# Patient Record
Sex: Female | Born: 1985 | Race: Black or African American | Hispanic: No | Marital: Single | Smoking: Never smoker
Health system: Southern US, Community
[De-identification: ages and names within clinical notes are randomized; demographics above are authoritative.]

## PROBLEM LIST (undated history)

## (undated) DIAGNOSIS — J45909 Unspecified asthma, uncomplicated: Secondary | ICD-10-CM

## (undated) DIAGNOSIS — Z9889 Other specified postprocedural states: Secondary | ICD-10-CM

## (undated) DIAGNOSIS — N390 Urinary tract infection, site not specified: Secondary | ICD-10-CM

## (undated) HISTORY — PX: ARTHROSCOPY KNEE W/ DRILLING: SUR92

## (undated) HISTORY — DX: Unspecified asthma, uncomplicated: J45.909

---

## 2012-06-27 ENCOUNTER — Ambulatory Visit (INDEPENDENT_AMBULATORY_CARE_PROVIDER_SITE_OTHER): Payer: 59 | Admitting: Obstetrics & Gynecology

## 2012-06-27 ENCOUNTER — Encounter: Payer: Self-pay | Admitting: Obstetrics & Gynecology

## 2012-06-27 VITALS — BP 116/79 | HR 62 | Temp 98.5°F | Resp 16 | Ht 67.0 in | Wt 163.0 lb

## 2012-06-27 DIAGNOSIS — Z124 Encounter for screening for malignant neoplasm of cervix: Secondary | ICD-10-CM

## 2012-06-27 DIAGNOSIS — Z0000 Encounter for general adult medical examination without abnormal findings: Secondary | ICD-10-CM

## 2012-06-27 DIAGNOSIS — Z113 Encounter for screening for infections with a predominantly sexual mode of transmission: Secondary | ICD-10-CM

## 2012-06-27 DIAGNOSIS — Z01419 Encounter for gynecological examination (general) (routine) without abnormal findings: Secondary | ICD-10-CM

## 2012-06-27 DIAGNOSIS — Z1151 Encounter for screening for human papillomavirus (HPV): Secondary | ICD-10-CM

## 2012-06-27 MED ORDER — NORGESTIMATE-ETH ESTRADIOL 0.25-35 MG-MCG PO TABS: 1.0000 | ORAL_TABLET | Freq: Every day | ORAL | Status: DC

## 2012-06-27 NOTE — Progress Notes (Signed)
Subjective:    Mackenzie Kennedy is a 26 y.o. female who presents for an annual exam. The patient has no complaints today. Her periods are 6 days and she showed me a picture on her cell phone of a clot/"mucous" she had with her last period. The patient is sexually active. GYN screening history: last pap: was normal. The patient wears seatbelts: yes. The patient participates in regular exercise: yes. Has the patient ever been transfused or tattooed?: yes. The patient reports that there is not domestic violence in her life.   Menstrual History: OB History    Grav Para Term Preterm Abortions TAB SAB Ect Mult Living   0 0 0 0 0 0 0 0 0 0       Menarche age: 32 Patient's last menstrual period was 06/20/2012.    The following portions of the patient's history were reviewed and updated as appropriate: allergies, current medications, past family history, past medical history, past social history, past surgical history and problem list.  Review of Systems A comprehensive review of systems was negative. She is a Radio producer at [employer]. She is engaged to be married May 2014. 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 She denies dysparunia. She has had her flu shot this year.   Objective:    BP 116/79  Pulse 62  Temp 98.5 F (36.9 C) (Oral)  Resp 16  Ht 5\' 7"  (1.702 m)  Wt 163 lb (73.936 kg)  BMI 25.53 kg/m2  LMP 06/20/2012  General Appearance:    Alert, cooperative, no distress, appears stated age  Head:    Normocephalic, without obvious abnormality, atraumatic  Eyes:    PERRL, conjunctiva/corneas clear, EOM's intact, fundi    benign, both eyes  Ears:    Normal TM's and external ear canals, both ears  Nose:   Nares normal, septum midline, mucosa normal, no drainage    or sinus tenderness  Throat:   Lips, mucosa, and tongue normal; teeth and gums normal  Neck:   Supple, symmetrical, trachea midline, no adenopathy;    thyroid:  no enlargement/tenderness/nodules; no carotid  bruit or JVD  Back:     Symmetric, no curvature, ROM normal, no CVA tenderness  Lungs:     Clear to auscultation bilaterally, respirations unlabored  Chest Wall:    No tenderness or deformity   Heart:    Regular rate and rhythm, S1 and S2 normal, no murmur, rub   or gallop  Breast Exam:    No tenderness, masses, or nipple abnormality  Abdomen:     Soft, non-tender, bowel sounds active all four quadrants,    no masses, no organomegaly  Genitalia:    Normal female without lesion, discharge or tenderness, NSSA, NT, mobile, no adnexal tenderness or masses     Extremities:   Extremities normal, atraumatic, no cyanosis or edema  Pulses:   2+ and symmetric all extremities  Skin:   Skin color, texture, turgor normal, no rashes or lesions  Lymph nodes:   Cervical, supraclavicular, and axillary nodes normal  Neurologic:   CNII-XII intact, normal strength, sensation and reflexes    throughout  .    Assessment:    Healthy female exam.    Plan:     Thin prep Pap smear.  I have told her how to manage her periods by taking active pills continuously. She is disinclined to do this as she likes having the monthly reassuance that she is not pregnant. She will research whether or  not she has had the Gardasil series and will call if she has not.

## 2013-08-06 ENCOUNTER — Other Ambulatory Visit: Payer: Self-pay | Admitting: *Deleted

## 2013-08-06 DIAGNOSIS — IMO0001 Reserved for inherently not codable concepts without codable children: Secondary | ICD-10-CM

## 2013-08-06 MED ORDER — NORGESTIMATE-ETH ESTRADIOL 0.25-35 MG-MCG PO TABS: 1.0000 | ORAL_TABLET | Freq: Every day | ORAL | Status: DC

## 2013-08-06 NOTE — Telephone Encounter (Signed)
Request for 1 Rf on Sprintec sent to [facility] [address] in [place] .  Pt has schedule an appt.

## 2013-08-20 ENCOUNTER — Encounter: Payer: Self-pay | Admitting: *Deleted

## 2013-08-20 ENCOUNTER — Ambulatory Visit (INDEPENDENT_AMBULATORY_CARE_PROVIDER_SITE_OTHER): Payer: 59 | Admitting: Obstetrics & Gynecology

## 2013-08-20 ENCOUNTER — Encounter: Payer: Self-pay | Admitting: Obstetrics & Gynecology

## 2013-08-20 VITALS — BP 115/74 | HR 85 | Resp 16 | Ht 68.0 in | Wt 170.0 lb

## 2013-08-20 DIAGNOSIS — Z01419 Encounter for gynecological examination (general) (routine) without abnormal findings: Secondary | ICD-10-CM

## 2013-08-20 DIAGNOSIS — Z124 Encounter for screening for malignant neoplasm of cervix: Secondary | ICD-10-CM

## 2013-08-20 DIAGNOSIS — Z309 Encounter for contraceptive management, unspecified: Secondary | ICD-10-CM

## 2013-08-20 DIAGNOSIS — Z0000 Encounter for general adult medical examination without abnormal findings: Secondary | ICD-10-CM

## 2013-08-20 DIAGNOSIS — IMO0001 Reserved for inherently not codable concepts without codable children: Secondary | ICD-10-CM

## 2013-08-20 MED ORDER — NORGESTIMATE-ETH ESTRADIOL 0.25-35 MG-MCG PO TABS: 1.0000 | ORAL_TABLET | Freq: Every day | ORAL | Status: DC

## 2013-08-20 NOTE — Patient Instructions (Signed)

## 2013-08-20 NOTE — Progress Notes (Signed)
Subjective:    Mackenzie Kennedy is a 27 y.o. M G73female who presents for an annual exam. The patient has no complaints today. She needs refills of her OCPs. The patient is sexually active. GYN screening history: last pap: was normal. The patient wears seatbelts: yes. The patient participates in regular exercise: yes.(runner)  Has the patient ever been transfused or tattooed?: yes. (tattoo) The patient reports that there is not domestic violence in her life.   Menstrual History: OB History   Grav Para Term Preterm Abortions TAB SAB Ect Mult Living   0 0 0 0 0 0 0 0 0 0       Menarche age: 48 Coitarche: 29   Patient's last menstrual period was 08/13/2013.    The following portions of the patient's history were reviewed and updated as appropriate: allergies, current medications, past family history, past medical history, past social history, past surgical history and problem list.  Review of Systems A comprehensive review of systems was negative. She has not needed an inhaler in 3 years. Married for 7 months, denies dyspareunia. Works at [employer], Production designer, theatre/television/film for a [employer].   Objective:    BP 115/74  Pulse 85  Resp 16  Ht 5\' 8"  (1.727 m)  Wt 170 lb (77.111 kg)  BMI 25.85 kg/m2  LMP 08/13/2013  General Appearance:    Alert, cooperative, no distress, appears stated age  Head:    Normocephalic, without obvious abnormality, atraumatic  Eyes:    PERRL, conjunctiva/corneas clear, EOM's intact, fundi    benign, both eyes  Ears:    Normal TM's and external ear canals, both ears  Nose:   Nares normal, septum midline, mucosa normal, no drainage    or sinus tenderness  Throat:   Lips, mucosa, and tongue normal; teeth and gums normal  Neck:   Supple, symmetrical, trachea midline, no adenopathy;    thyroid:  no enlargement/tenderness/nodules; no carotid   bruit or JVD  Back:     Symmetric, no curvature, ROM normal, no CVA tenderness  Lungs:     Clear to auscultation bilaterally,  respirations unlabored  Chest Wall:    No tenderness or deformity   Heart:    Regular rate and rhythm, S1 and S2 normal, no murmur, rub   or gallop  Breast Exam:    No tenderness, masses, or nipple abnormality  Abdomen:     Soft, non-tender, bowel sounds active all four quadrants,    no masses, no organomegaly  Genitalia:    Normal female without lesion, discharge or tenderness     Extremities:   Extremities normal, atraumatic, no cyanosis or edema  Pulses:   2+ and symmetric all extremities  Skin:   Skin color, texture, turgor normal, no rashes or lesions  Lymph nodes:   Cervical, supraclavicular, and axillary nodes normal  Neurologic:   CNII-XII intact, normal strength, sensation and reflexes    throughout  .    Assessment:    Healthy female exam.    Plan:     Breast self exam technique reviewed and patient encouraged to perform self-exam monthly. Thin prep Pap smear.

## 2014-03-05 ENCOUNTER — Ambulatory Visit (INDEPENDENT_AMBULATORY_CARE_PROVIDER_SITE_OTHER): Payer: 59 | Admitting: Obstetrics & Gynecology

## 2014-03-05 ENCOUNTER — Encounter: Payer: Self-pay | Admitting: Obstetrics & Gynecology

## 2014-03-05 VITALS — BP 124/84 | HR 88 | Wt 183.0 lb

## 2014-03-05 DIAGNOSIS — Z3400 Encounter for supervision of normal first pregnancy, unspecified trimester: Secondary | ICD-10-CM

## 2014-03-05 DIAGNOSIS — Z124 Encounter for screening for malignant neoplasm of cervix: Secondary | ICD-10-CM

## 2014-03-05 DIAGNOSIS — Z3401 Encounter for supervision of normal first pregnancy, first trimester: Secondary | ICD-10-CM

## 2014-03-05 DIAGNOSIS — Z113 Encounter for screening for infections with a predominantly sexual mode of transmission: Secondary | ICD-10-CM

## 2014-03-05 NOTE — Progress Notes (Signed)
Initial prenatal appt today.  Bedside u/s showed IUp with FHT of 160 and CRL 16.102mm and GA of 8w

## 2014-03-05 NOTE — Progress Notes (Signed)
   Subjective:    Mackenzie Kennedy is a G1P0000 6675w1d being seen today for her first obstetrical visit.  Her obstetrical history is significant for none. Patient does intend to breast feed. Pregnancy history fully reviewed.  Patient reports She had spotting old brown blood for about 2 weeks but stopped last week.Ceasar Mons.  Filed Vitals:   03/05/14 1441  BP: 124/84  Pulse: 88  Weight: 183 lb (83.008 kg)    HISTORY: OB History  Gravida Para Term Preterm AB SAB TAB Ectopic Multiple Living  1 0 0 0 0 0 0 0 0 0     # Outcome Date GA Lbr Len/2nd Weight Sex Delivery Anes PTL Lv  1 CUR              Past Medical History  Diagnosis Date  . Asthma   . Pneumonia    Past Surgical History  Procedure Laterality Date  . Arthroscopy knee w/ drilling      lt   Family History  Problem Relation Age of Onset  . Diabetes Mother      Exam    Uterus:     Pelvic Exam:    Perineum: No Hemorrhoids   Vulva: normal   Vagina:  normal mucosa   pH:    Cervix: anteverted   Adnexa: normal adnexa   Bony Pelvis: android  System: Breast:  normal appearance, no masses or tenderness   Skin: normal coloration and turgor, no rashes    Neurologic: oriented   Extremities: normal strength, tone, and muscle mass   HEENT PERRLA   Mouth/Teeth mucous membranes moist, pharynx normal without lesions   Neck supple   Cardiovascular: regular rate and rhythm   Respiratory:  appears well, vitals normal, no respiratory distress, acyanotic, normal RR, ear and throat exam is normal, neck free of mass or lymphadenopathy, chest clear, no wheezing, crepitations, rhonchi, normal symmetric air entry   Abdomen: soft, non-tender; bowel sounds normal; no masses,  no organomegaly   Urinary: urethral meatus normal      Assessment:    Pregnancy: G1P0000 There are no active problems to display for this patient.       Plan:     Initial labs drawn. Prenatal vitamins. Problem list reviewed and updated. Genetic Screening  discussed Quad Screen: requested. She declines First Screen.  Ultrasound discussed; fetal survey: requested.  Follow up in 4 weeks.   Christella App C. 03/05/2014

## 2014-03-06 LAB — OBSTETRIC PANEL
ANTIBODY SCREEN: NEGATIVE
BASOS PCT: 1 % (ref 0–1)
Basophils Absolute: 0.1 10*3/uL (ref 0.0–0.1)
Eosinophils Absolute: 0.2 10*3/uL (ref 0.0–0.7)
Eosinophils Relative: 3 % (ref 0–5)
HEMATOCRIT: 38.3 % (ref 36.0–46.0)
HEMOGLOBIN: 12.9 g/dL (ref 12.0–15.0)
Hepatitis B Surface Ag: NEGATIVE
LYMPHS ABS: 2.3 10*3/uL (ref 0.7–4.0)
Lymphocytes Relative: 37 % (ref 12–46)
MCH: 28.3 pg (ref 26.0–34.0)
MCHC: 33.7 g/dL (ref 30.0–36.0)
MCV: 84 fL (ref 78.0–100.0)
MONO ABS: 0.6 10*3/uL (ref 0.1–1.0)
MONOS PCT: 10 % (ref 3–12)
NEUTROS ABS: 3.1 10*3/uL (ref 1.7–7.7)
Neutrophils Relative %: 49 % (ref 43–77)
Platelets: 270 10*3/uL (ref 150–400)
RBC: 4.56 MIL/uL (ref 3.87–5.11)
RDW: 14.8 % (ref 11.5–15.5)
RH TYPE: POSITIVE
RUBELLA: 0.78 {index} (ref <0.90)
WBC: 6.3 10*3/uL (ref 4.0–10.5)

## 2014-03-06 LAB — HIV ANTIBODY (ROUTINE TESTING W REFLEX): HIV 1&2 Ab, 4th Generation: NONREACTIVE

## 2014-03-06 LAB — SICKLE CELL SCREEN: SICKLE CELL SCREEN: NEGATIVE

## 2014-03-09 LAB — CULTURE, URINE COMPREHENSIVE

## 2014-03-09 LAB — CYTOLOGY - PAP

## 2014-03-10 ENCOUNTER — Telehealth: Payer: Self-pay | Admitting: *Deleted

## 2014-03-10 DIAGNOSIS — B9620 Unspecified Escherichia coli [E. coli] as the cause of diseases classified elsewhere: Secondary | ICD-10-CM | POA: Insufficient documentation

## 2014-03-10 DIAGNOSIS — N390 Urinary tract infection, site not specified: Secondary | ICD-10-CM

## 2014-03-10 MED ORDER — CEPHALEXIN 500 MG PO CAPS: 500.0000 mg | ORAL_CAPSULE | Freq: Three times a day (TID) | ORAL | Status: DC

## 2014-03-10 NOTE — Telephone Encounter (Signed)
Pt notified of UTI and RX sent to pharmacy

## 2014-03-10 NOTE — Telephone Encounter (Signed)
Message copied by Granville LewisLARK, LORA L on Tue Mar 10, 2014  2:55 PM ------      Message from: Allie BossierVE, MYRA C      Created: Tue Mar 10, 2014  2:23 PM       She needs a prescrilption for Keflex 500 mg 1 po QID for a week for a UTI.      Thanks ------

## 2014-04-03 ENCOUNTER — Ambulatory Visit (INDEPENDENT_AMBULATORY_CARE_PROVIDER_SITE_OTHER): Payer: 59 | Admitting: Family

## 2014-04-03 VITALS — BP 142/88 | HR 117 | Wt 178.0 lb

## 2014-04-03 DIAGNOSIS — Z3480 Encounter for supervision of other normal pregnancy, unspecified trimester: Secondary | ICD-10-CM

## 2014-04-03 DIAGNOSIS — Z3490 Encounter for supervision of normal pregnancy, unspecified, unspecified trimester: Secondary | ICD-10-CM | POA: Insufficient documentation

## 2014-04-03 DIAGNOSIS — Z3491 Encounter for supervision of normal pregnancy, unspecified, first trimester: Secondary | ICD-10-CM

## 2014-04-03 NOTE — Progress Notes (Signed)
Reviewed BP with patient.  Discussed if elevated at next visit will obtain baseline PreX labs.  Husband completed 2nd year of medical school, great questions regarding plan of care.

## 2014-05-07 ENCOUNTER — Encounter: Payer: Self-pay | Admitting: Obstetrics & Gynecology

## 2014-05-07 ENCOUNTER — Ambulatory Visit (INDEPENDENT_AMBULATORY_CARE_PROVIDER_SITE_OTHER): Payer: 59 | Admitting: Obstetrics & Gynecology

## 2014-05-07 VITALS — BP 136/75 | HR 94 | Wt 183.0 lb

## 2014-05-07 DIAGNOSIS — Z3480 Encounter for supervision of other normal pregnancy, unspecified trimester: Secondary | ICD-10-CM

## 2014-05-07 DIAGNOSIS — Z3492 Encounter for supervision of normal pregnancy, unspecified, second trimester: Secondary | ICD-10-CM

## 2014-05-07 NOTE — Progress Notes (Signed)
Pt declines quad testing at this time.  She knows she has 3 more weeks for change mind.  Anatomy US ordered.  Not hypertensive today.

## 2014-05-18 ENCOUNTER — Ambulatory Visit (HOSPITAL_COMMUNITY)
Admission: RE | Admit: 2014-05-18 | Discharge: 2014-05-18 | Disposition: A | Discharge status: Home / Self Care | Payer: 59 | Source: Ambulatory Visit | Attending: Obstetrics & Gynecology | Admitting: Obstetrics & Gynecology

## 2014-05-18 DIAGNOSIS — Z3492 Encounter for supervision of normal pregnancy, unspecified, second trimester: Secondary | ICD-10-CM

## 2014-05-18 DIAGNOSIS — Z3689 Encounter for other specified antenatal screening: Secondary | ICD-10-CM | POA: Insufficient documentation

## 2014-05-20 ENCOUNTER — Encounter: Payer: Self-pay | Admitting: Obstetrics & Gynecology

## 2014-06-03 ENCOUNTER — Ambulatory Visit (INDEPENDENT_AMBULATORY_CARE_PROVIDER_SITE_OTHER): Payer: 59 | Admitting: Obstetrics & Gynecology

## 2014-06-03 VITALS — BP 118/74 | HR 79 | Wt 187.0 lb

## 2014-06-03 DIAGNOSIS — Z3492 Encounter for supervision of normal pregnancy, unspecified, second trimester: Secondary | ICD-10-CM

## 2014-06-03 DIAGNOSIS — Z23 Encounter for immunization: Secondary | ICD-10-CM

## 2014-06-03 MED ORDER — INFLUENZA VAC SPLIT QUAD 0.5 ML IM SUSY
0.5000 mL | PREFILLED_SYRINGE | Freq: Once | INTRAMUSCULAR | Status: AC
  Administered 2014-06-03: 0.5 mL via INTRAMUSCULAR

## 2014-06-03 NOTE — Progress Notes (Signed)
Flu shot.  Discussed limitd views of spine and heart and MFM recommendations for F/U anatomy.  Pt will discuss with her husband and will call us today and let us know if she would like another US.

## 2014-06-29 ENCOUNTER — Encounter: Payer: Self-pay | Admitting: Obstetrics & Gynecology

## 2014-07-02 ENCOUNTER — Ambulatory Visit (INDEPENDENT_AMBULATORY_CARE_PROVIDER_SITE_OTHER): Payer: 59 | Admitting: Obstetrics & Gynecology

## 2014-07-02 VITALS — BP 112/70 | HR 89 | Wt 193.0 lb

## 2014-07-02 DIAGNOSIS — Z3492 Encounter for supervision of normal pregnancy, unspecified, second trimester: Secondary | ICD-10-CM

## 2014-07-02 NOTE — Progress Notes (Signed)
Uro-4

## 2014-07-02 NOTE — Progress Notes (Signed)
Pt declines further anatomy scan evaluations.  Tdap and GCT next visit.  Discussed fetal movement.

## 2014-07-30 ENCOUNTER — Encounter: Payer: Self-pay | Admitting: Obstetrics & Gynecology

## 2014-07-30 ENCOUNTER — Ambulatory Visit (INDEPENDENT_AMBULATORY_CARE_PROVIDER_SITE_OTHER): Payer: 59 | Admitting: Obstetrics & Gynecology

## 2014-07-30 VITALS — BP 128/80 | HR 82 | Wt 196.0 lb

## 2014-07-30 DIAGNOSIS — Z36 Encounter for antenatal screening of mother: Secondary | ICD-10-CM

## 2014-07-30 DIAGNOSIS — Z3493 Encounter for supervision of normal pregnancy, unspecified, third trimester: Secondary | ICD-10-CM

## 2014-07-30 DIAGNOSIS — Z23 Encounter for immunization: Secondary | ICD-10-CM

## 2014-07-30 MED ORDER — TETANUS-DIPHTH-ACELL PERTUSSIS 5-2.5-18.5 LF-MCG/0.5 IM SUSP
0.5000 mL | Freq: Once | INTRAMUSCULAR | Status: AC
  Administered 2014-07-30: 0.5 mL via INTRAMUSCULAR

## 2014-07-30 NOTE — Progress Notes (Signed)
Routine visit. Good FM. No problems. Glucola, tdap, labs today. 

## 2014-07-31 LAB — CBC
HCT: 33.9 % — ABNORMAL LOW (ref 36.0–46.0)
Hemoglobin: 11.9 g/dL — ABNORMAL LOW (ref 12.0–15.0)
MCH: 29.2 pg (ref 26.0–34.0)
MCHC: 35.1 g/dL (ref 30.0–36.0)
MCV: 83.3 fL (ref 78.0–100.0)
MPV: 11.9 fL (ref 9.4–12.4)
PLATELETS: 239 10*3/uL (ref 150–400)
RBC: 4.07 MIL/uL (ref 3.87–5.11)
RDW: 14.1 % (ref 11.5–15.5)
WBC: 12.5 10*3/uL — ABNORMAL HIGH (ref 4.0–10.5)

## 2014-07-31 LAB — GLUCOSE TOLERANCE, 1 HOUR (50G) W/O FASTING: Glucose, 1 Hour GTT: 103 mg/dL (ref 70–140)

## 2014-07-31 LAB — HIV ANTIBODY (ROUTINE TESTING W REFLEX): HIV 1&2 Ab, 4th Generation: NONREACTIVE

## 2014-07-31 LAB — RPR

## 2014-08-06 ENCOUNTER — Other Ambulatory Visit (INDEPENDENT_AMBULATORY_CARE_PROVIDER_SITE_OTHER): Payer: Self-pay

## 2014-08-06 NOTE — Progress Notes (Signed)
FMLA forms completed for [employer]. Forms scanned into chart. Pt needs to sign and will need to pick up before faxing.

## 2014-08-13 ENCOUNTER — Ambulatory Visit (INDEPENDENT_AMBULATORY_CARE_PROVIDER_SITE_OTHER): Payer: 59 | Admitting: Obstetrics & Gynecology

## 2014-08-13 VITALS — BP 109/71 | HR 85 | Wt 198.0 lb

## 2014-08-13 DIAGNOSIS — Z3493 Encounter for supervision of normal pregnancy, unspecified, third trimester: Secondary | ICD-10-CM

## 2014-08-13 NOTE — Progress Notes (Signed)
Pt doing well.  Passed GCT.  No problems. Received Tdap.

## 2014-08-26 ENCOUNTER — Ambulatory Visit (INDEPENDENT_AMBULATORY_CARE_PROVIDER_SITE_OTHER): Payer: 59 | Admitting: Obstetrics & Gynecology

## 2014-08-26 VITALS — BP 117/77 | HR 92 | Wt 199.0 lb

## 2014-08-26 DIAGNOSIS — Z3490 Encounter for supervision of normal pregnancy, unspecified, unspecified trimester: Secondary | ICD-10-CM

## 2014-08-26 NOTE — Progress Notes (Signed)
Pt states she has LUQ pain. No c/o acid reflux or indigestion

## 2014-08-26 NOTE — Progress Notes (Signed)
Routine visit. Good FM. No problems. She plans to do some classes in the next few weeks.

## 2014-08-28 NOTE — L&D Delivery Note (Signed)
Operative Delivery Note At 4:18 PM a viable female was delivered via Vaginal, Vacuum Investment banker, operational(Extractor).  Presentation: vertex; Position: Anterior; Station: +2. 1 true pop-off  Verbal consent: obtained from patient.  Risks and benefits discussed in detail.  Vacuum performed 2/2 prolonged deceleration to 60s, contractions spaced out, delivered over 6+ contractions with good movement each contraction, after 2 contractions deceleration resolved although did go down to 60-70s with each contraction, subsequently returning to baseline and with good variability. Risks include, but are not limited to the risks of anesthesia, bleeding, infection, damage to maternal tissues, fetal cephalhematoma.  There is also the risk of inability to effect vaginal delivery of the head, or shoulder dystocia that cannot be resolved by established maneuvers, leading to the need for emergency cesarean section.  NICU called for delivery  APGAR: 5, 8; weight  .   Placenta status: Intact, Spontaneous.   Cord: 3 vessels with the following complications: None.  Cord pH: 7.05  Anesthesia: Epidural  Instruments: Mushroom Vaccuum Episiotomy: Right lateral Lacerations: 2nd degree, essentially only episiotomy Suture Repair: 3.0 vicryl rapide Est. Blood Loss (mL):  300mL  Mom to postpartum.  Baby to Couplet care / Skin to Skin.  [name] 10/18/2014, 4:38 PM

## 2014-09-10 ENCOUNTER — Ambulatory Visit (INDEPENDENT_AMBULATORY_CARE_PROVIDER_SITE_OTHER): Payer: 59 | Admitting: Obstetrics & Gynecology

## 2014-09-10 VITALS — BP 132/83 | HR 81 | Wt 201.0 lb

## 2014-09-10 DIAGNOSIS — Z3403 Encounter for supervision of normal first pregnancy, third trimester: Secondary | ICD-10-CM

## 2014-09-10 NOTE — Progress Notes (Signed)
Info on GBS given to patient. She will have this done next week.

## 2014-09-10 NOTE — Progress Notes (Signed)
Routine visit. Good FM. She took childbirth classes. No problems. Schedule u/s for size less than dates

## 2014-09-17 ENCOUNTER — Ambulatory Visit (INDEPENDENT_AMBULATORY_CARE_PROVIDER_SITE_OTHER): Payer: 59 | Admitting: Obstetrics & Gynecology

## 2014-09-17 ENCOUNTER — Other Ambulatory Visit: Payer: Self-pay | Admitting: Obstetrics & Gynecology

## 2014-09-17 VITALS — BP 119/73 | HR 80 | Wt 202.0 lb

## 2014-09-17 DIAGNOSIS — Z3493 Encounter for supervision of normal pregnancy, unspecified, third trimester: Secondary | ICD-10-CM

## 2014-09-17 DIAGNOSIS — Z36 Encounter for antenatal screening of mother: Secondary | ICD-10-CM

## 2014-09-17 LAB — OB RESULTS CONSOLE GBS: STREP GROUP B AG: POSITIVE

## 2014-09-17 LAB — OB RESULTS CONSOLE GC/CHLAMYDIA
CHLAMYDIA, DNA PROBE: NEGATIVE
Gonorrhea: NEGATIVE

## 2014-09-17 NOTE — Progress Notes (Signed)
Cultures today.  No problems.

## 2014-09-17 NOTE — Progress Notes (Signed)
36 wk cultures today   

## 2014-09-18 LAB — GC/CHLAMYDIA PROBE AMP
CT Probe RNA: NEGATIVE
GC Probe RNA: NEGATIVE

## 2014-09-19 LAB — CULTURE, BETA STREP (GROUP B ONLY)

## 2014-09-25 ENCOUNTER — Ambulatory Visit (INDEPENDENT_AMBULATORY_CARE_PROVIDER_SITE_OTHER): Payer: 59 | Admitting: Advanced Practice Midwife

## 2014-09-25 VITALS — BP 118/77 | HR 86 | Wt 201.0 lb

## 2014-09-25 DIAGNOSIS — Z3493 Encounter for supervision of normal pregnancy, unspecified, third trimester: Secondary | ICD-10-CM

## 2014-09-25 NOTE — Patient Instructions (Signed)
Mackenzie Kennedy Contractions °Contractions of the uterus can occur throughout pregnancy. Contractions are not always a sign that you are in labor.  °WHAT ARE Mackenzie Kennedy CONTRACTIONS?  °Contractions that occur before labor are called Mackenzie Kennedy contractions, or false labor. Toward the end of pregnancy (32-34 weeks), these contractions can develop more often and may become more forceful. This is not true labor because these contractions do not result in opening (dilatation) and thinning of the cervix. They are sometimes difficult to tell apart from true labor because these contractions can be forceful and people have different pain tolerances. You should not feel embarrassed if you go to the hospital with false labor. Sometimes, the only way to tell if you are in true labor is for your health care provider to look for changes in the cervix. °If there are no prenatal problems or other health problems associated with the pregnancy, it is completely safe to be sent home with false labor and await the onset of true labor. °HOW CAN YOU TELL THE DIFFERENCE BETWEEN TRUE AND FALSE LABOR? °False Labor °· The contractions of false labor are usually shorter and not as hard as those of true labor.   °· The contractions are usually irregular.   °· The contractions are often felt in the front of the lower abdomen and in the groin.   °· The contractions may go away when you walk around or change positions while lying down.   °· The contractions get weaker and are shorter lasting as time goes on.   °· The contractions do not usually become progressively stronger, regular, and closer together as with true labor.   °True Labor °1. Contractions in true labor last 30-70 seconds, become very regular, usually become more intense, and increase in frequency.   °2. The contractions do not go away with walking.   °3. The discomfort is usually felt in the top of the uterus and spreads to the lower abdomen and low back.   °4. True labor can  be determined by your health care provider with an exam. This will show that the cervix is dilating and getting thinner.   °WHAT TO REMEMBER °· Keep up with your usual exercises and follow other instructions given by your health care provider.   °· Take medicines as directed by your health care provider.   °· Keep your regular prenatal appointments.   °· Eat and drink lightly if you think you are going into labor.   °· If Mackenzie Kennedy contractions are making you uncomfortable:   °· Change your position from lying down or resting to walking, or from walking to resting.   °· Sit and rest in a tub of warm water.   °· Drink 2-3 glasses of water. Dehydration may cause these contractions.   °· Do slow and deep breathing several times an hour.   °WHEN SHOULD I SEEK IMMEDIATE MEDICAL CARE? °Seek immediate medical care if: °· Your contractions become stronger, more regular, and closer together.   °· You have fluid leaking or gushing from your vagina.   °· You have a fever.   °· You pass blood-tinged mucus.   °· You have vaginal bleeding.   °· You have continuous abdominal pain.   °· You have low back pain that you never had before.   °· You feel your baby's head pushing down and causing pelvic pressure.   °· Your baby is not moving as much as it used to.   °Document Released: 08/14/2005 Document Revised: 08/19/2013 Document Reviewed: 05/26/2013 °Exit[employer]® Patient Information ©2015 [employer]. This information is not intended to replace advice given to you by your health care   provider. Make sure you discuss any questions you have with your health care provider. ° °Fetal Movement Counts °Patient Name: __________________________________________________ Patient Due Date: ____________________ °Performing a fetal movement count is highly recommended in high-risk pregnancies, but it is good for every pregnant woman to do. Your health care provider may ask you to start counting fetal movements at 28 weeks of the pregnancy. Fetal  movements often increase: °· After eating a full meal. °· After physical activity. °· After eating or drinking something sweet or cold. °· At rest. °Pay attention to when you feel the baby is most active. This will help you notice a pattern of your baby's sleep and wake cycles and what factors contribute to an increase in fetal movement. It is important to perform a fetal movement count at the same time each day when your baby is normally most active.  °HOW TO COUNT FETAL MOVEMENTS °5. Find a quiet and comfortable area to sit or lie down on your left side. Lying on your left side provides the best blood and oxygen circulation to your baby. °6. Write down the day and time on a sheet of paper or in a journal. °7. Start counting kicks, flutters, swishes, rolls, or jabs in a 2-hour period. You should feel at least 10 movements within 2 hours. °8. If you do not feel 10 movements in 2 hours, wait 2-3 hours and count again. Look for a change in the pattern or not enough counts in 2 hours. °SEEK MEDICAL CARE IF: °· You feel less than 10 counts in 2 hours, tried twice. °· There is no movement in over an hour. °· The pattern is changing or taking longer each day to reach 10 counts in 2 hours. °· You feel the baby is not moving as he or she usually does. °Date: ____________ Movements: ____________ Start time: ____________ Finish time: ____________  °Date: ____________ Movements: ____________ Start time: ____________ Finish time: ____________ °Date: ____________ Movements: ____________ Start time: ____________ Finish time: ____________ °Date: ____________ Movements: ____________ Start time: ____________ Finish time: ____________ °Date: ____________ Movements: ____________ Start time: ____________ Finish time: ____________ °Date: ____________ Movements: ____________ Start time: ____________ Finish time: ____________ °Date: ____________ Movements: ____________ Start time: ____________ Finish time: ____________ °Date: ____________  Movements: ____________ Start time: ____________ Finish time: ____________  °Date: ____________ Movements: ____________ Start time: ____________ Finish time: ____________ °Date: ____________ Movements: ____________ Start time: ____________ Finish time: ____________ °Date: ____________ Movements: ____________ Start time: ____________ Finish time: ____________ °Date: ____________ Movements: ____________ Start time: ____________ Finish time: ____________ °Date: ____________ Movements: ____________ Start time: ____________ Finish time: ____________ °Date: ____________ Movements: ____________ Start time: ____________ Finish time: ____________ °Date: ____________ Movements: ____________ Start time: ____________ Finish time: ____________  °Date: ____________ Movements: ____________ Start time: ____________ Finish time: ____________ °Date: ____________ Movements: ____________ Start time: ____________ Finish time: ____________ °Date: ____________ Movements: ____________ Start time: ____________ Finish time: ____________ °Date: ____________ Movements: ____________ Start time: ____________ Finish time: ____________ °Date: ____________ Movements: ____________ Start time: ____________ Finish time: ____________ °Date: ____________ Movements: ____________ Start time: ____________ Finish time: ____________ °Date: ____________ Movements: ____________ Start time: ____________ Finish time: ____________  °Date: ____________ Movements: ____________ Start time: ____________ Finish time: ____________ °Date: ____________ Movements: ____________ Start time: ____________ Finish time: ____________ °Date: ____________ Movements: ____________ Start time: ____________ Finish time: ____________ °Date: ____________ Movements: ____________ Start time: ____________ Finish time: ____________ °Date: ____________ Movements: ____________ Start time: ____________ Finish time: ____________ °Date: ____________ Movements: ____________ Start time:  ____________ Finish time: ____________ °Date: ____________ Movements:   ____________ Start time: ____________ Finish time: ____________  °Date: ____________ Movements: ____________ Start time: ____________ Finish time: ____________ °Date: ____________ Movements: ____________ Start time: ____________ Finish time: ____________ °Date: ____________ Movements: ____________ Start time: ____________ Finish time: ____________ °Date: ____________ Movements: ____________ Start time: ____________ Finish time: ____________ °Date: ____________ Movements: ____________ Start time: ____________ Finish time: ____________ °Date: ____________ Movements: ____________ Start time: ____________ Finish time: ____________ °Date: ____________ Movements: ____________ Start time: ____________ Finish time: ____________  °Date: ____________ Movements: ____________ Start time: ____________ Finish time: ____________ °Date: ____________ Movements: ____________ Start time: ____________ Finish time: ____________ °Date: ____________ Movements: ____________ Start time: ____________ Finish time: ____________ °Date: ____________ Movements: ____________ Start time: ____________ Finish time: ____________ °Date: ____________ Movements: ____________ Start time: ____________ Finish time: ____________ °Date: ____________ Movements: ____________ Start time: ____________ Finish time: ____________ °Date: ____________ Movements: ____________ Start time: ____________ Finish time: ____________  °Date: ____________ Movements: ____________ Start time: ____________ Finish time: ____________ °Date: ____________ Movements: ____________ Start time: ____________ Finish time: ____________ °Date: ____________ Movements: ____________ Start time: ____________ Finish time: ____________ °Date: ____________ Movements: ____________ Start time: ____________ Finish time: ____________ °Date: ____________ Movements: ____________ Start time: ____________ Finish time: ____________ °Date:  ____________ Movements: ____________ Start time: ____________ Finish time: ____________ °Date: ____________ Movements: ____________ Start time: ____________ Finish time: ____________  °Date: ____________ Movements: ____________ Start time: ____________ Finish time: ____________ °Date: ____________ Movements: ____________ Start time: ____________ Finish time: ____________ °Date: ____________ Movements: ____________ Start time: ____________ Finish time: ____________ °Date: ____________ Movements: ____________ Start time: ____________ Finish time: ____________ °Date: ____________ Movements: ____________ Start time: ____________ Finish time: ____________ °Date: ____________ Movements: ____________ Start time: ____________ Finish time: ____________ °Document Released: 09/13/2006 Document Revised: 12/29/2013 Document Reviewed: 06/10/2012 °[employer]® Patient Information ©2015 [employer]. This information is not intended to replace advice given to you by your health care provider. Make sure you discuss any questions you have with your health care provider. ° °

## 2014-09-25 NOTE — Progress Notes (Signed)
GBS pos. PCN in labor. Measuring appropriately today. No growth US two visits ago. None needed  For now, but will watch fundal height closely.

## 2014-10-02 ENCOUNTER — Ambulatory Visit (INDEPENDENT_AMBULATORY_CARE_PROVIDER_SITE_OTHER): Payer: 59 | Admitting: Advanced Practice Midwife

## 2014-10-02 VITALS — BP 111/79 | HR 76 | Wt 203.0 lb

## 2014-10-02 DIAGNOSIS — Z3493 Encounter for supervision of normal pregnancy, unspecified, third trimester: Secondary | ICD-10-CM

## 2014-10-02 NOTE — Progress Notes (Signed)
Active baby 

## 2014-10-02 NOTE — Patient Instructions (Signed)
Braxton Peaden Contractions °Contractions of the uterus can occur throughout pregnancy. Contractions are not always a sign that you are in labor.  °WHAT ARE BRAXTON [place] CONTRACTIONS?  °Contractions that occur before labor are called Braxton [place]au contractions, or false labor. Toward the end of pregnancy (32-34 weeks), these contractions can develop more often and may become more forceful. This is not true labor because these contractions do not result in opening (dilatation) and thinning of the cervix. They are sometimes difficult to tell apart from true labor because these contractions can be forceful and people have different pain tolerances. You should not feel embarrassed if you go to the hospital with false labor. Sometimes, the only way to tell if you are in true labor is for your health care provider to look for changes in the cervix. °If there are no prenatal problems or other health problems associated with the pregnancy, it is completely safe to be sent home with false labor and await the onset of true labor. °HOW CAN YOU TELL THE DIFFERENCE BETWEEN TRUE AND FALSE LABOR? °False Labor °· The contractions of false labor are usually shorter and not as hard as those of true labor.   °· The contractions are usually irregular.   °· The contractions are often felt in the front of the lower abdomen and in the groin.   °· The contractions may go away when you walk around or change positions while lying down.   °· The contractions get weaker and are shorter lasting as time goes on.   °· The contractions do not usually become progressively stronger, regular, and closer together as with true labor.   °True Labor °· Contractions in true labor last 30-70 seconds, become very regular, usually become more intense, and increase in frequency.   °· The contractions do not go away with walking.   °· The discomfort is usually felt in the top of the uterus and spreads to the lower abdomen and low back.   °· True labor can be  determined by your health care provider with an exam. This will show that the cervix is dilating and getting thinner.   °WHAT TO REMEMBER °· Keep up with your usual exercises and follow other instructions given by your health care provider.   °· Take medicines as directed by your health care provider.   °· Keep your regular prenatal appointments.   °· Eat and drink lightly if you think you are going into labor.   °· If Braxton Trotter contractions are making you uncomfortable:   °¨ Change your position from lying down or resting to walking, or from walking to resting.   °¨ Sit and rest in a tub of warm water.   °¨ Drink 2-3 glasses of water. Dehydration may cause these contractions.   °¨ Do slow and deep breathing several times an hour.   °WHEN SHOULD I SEEK IMMEDIATE MEDICAL CARE? °Seek immediate medical care if: °· Your contractions become stronger, more regular, and closer together.   °· You have fluid leaking or gushing from your vagina.   °· You have a fever.   °· You pass blood-tinged mucus.   °· You have vaginal bleeding.   °· You have continuous abdominal pain.   °· You have low back pain that you never had before.   °· You feel your baby's head pushing down and causing pelvic pressure.   °· Your baby is not moving as much as it used to.   °Document Released: 08/14/2005 Document Revised: 08/19/2013 Document Reviewed: 05/26/2013 °ExitCare® Patient Information ©2015 [employer]. This information is not intended to replace advice given to you by your health care   provider. Make sure you discuss any questions you have with your health care provider. ° °

## 2014-10-09 ENCOUNTER — Ambulatory Visit (INDEPENDENT_AMBULATORY_CARE_PROVIDER_SITE_OTHER): Payer: 59 | Admitting: Family

## 2014-10-09 VITALS — BP 130/76 | HR 88 | Wt 206.0 lb

## 2014-10-09 DIAGNOSIS — Z3493 Encounter for supervision of normal pregnancy, unspecified, third trimester: Secondary | ICD-10-CM

## 2014-10-09 NOTE — Progress Notes (Signed)
Doing well; no questions or concerns. Reviewed signs of labor.

## 2014-10-16 ENCOUNTER — Encounter: Payer: Self-pay | Admitting: Advanced Practice Midwife

## 2014-10-16 ENCOUNTER — Ambulatory Visit (INDEPENDENT_AMBULATORY_CARE_PROVIDER_SITE_OTHER): Payer: 59 | Admitting: Advanced Practice Midwife

## 2014-10-16 VITALS — BP 119/75 | HR 86 | Wt 209.0 lb

## 2014-10-16 DIAGNOSIS — O480 Post-term pregnancy: Secondary | ICD-10-CM

## 2014-10-16 DIAGNOSIS — Z3403 Encounter for supervision of normal first pregnancy, third trimester: Secondary | ICD-10-CM

## 2014-10-16 DIAGNOSIS — Z3493 Encounter for supervision of normal pregnancy, unspecified, third trimester: Secondary | ICD-10-CM

## 2014-10-16 DIAGNOSIS — Z3A40 40 weeks gestation of pregnancy: Secondary | ICD-10-CM

## 2014-10-16 NOTE — Patient Instructions (Signed)

## 2014-10-16 NOTE — Progress Notes (Signed)
Doing well Birth routines reviewed. Plans epidural. Wants cervical check today. IOL scheduled for next week on Feb 26 7:30pm

## 2014-10-18 ENCOUNTER — Inpatient Hospital Stay (HOSPITAL_COMMUNITY)
Admission: AD | Admit: 2014-10-18 | Discharge: 2014-10-20 | DRG: 775 | Disposition: A | Discharge status: Home / Self Care | Payer: 59 | Source: Ambulatory Visit | Attending: Obstetrics and Gynecology | Admitting: Obstetrics and Gynecology

## 2014-10-18 ENCOUNTER — Inpatient Hospital Stay (HOSPITAL_COMMUNITY): Payer: 59 | Admitting: Anesthesiology

## 2014-10-18 ENCOUNTER — Encounter (HOSPITAL_COMMUNITY): Payer: Self-pay

## 2014-10-18 DIAGNOSIS — Z3A40 40 weeks gestation of pregnancy: Secondary | ICD-10-CM | POA: Diagnosis present

## 2014-10-18 DIAGNOSIS — Z833 Family history of diabetes mellitus: Secondary | ICD-10-CM

## 2014-10-18 DIAGNOSIS — N390 Urinary tract infection, site not specified: Secondary | ICD-10-CM

## 2014-10-18 DIAGNOSIS — Z3493 Encounter for supervision of normal pregnancy, unspecified, third trimester: Secondary | ICD-10-CM

## 2014-10-18 DIAGNOSIS — B9620 Unspecified Escherichia coli [E. coli] as the cause of diseases classified elsewhere: Secondary | ICD-10-CM

## 2014-10-18 DIAGNOSIS — IMO0001 Reserved for inherently not codable concepts without codable children: Secondary | ICD-10-CM

## 2014-10-18 DIAGNOSIS — O471 False labor at or after 37 completed weeks of gestation: Secondary | ICD-10-CM | POA: Diagnosis present

## 2014-10-18 DIAGNOSIS — O99824 Streptococcus B carrier state complicating childbirth: Secondary | ICD-10-CM | POA: Diagnosis present

## 2014-10-18 HISTORY — DX: Other specified postprocedural states: Z98.890

## 2014-10-18 HISTORY — DX: Urinary tract infection, site not specified: N39.0

## 2014-10-18 LAB — CBC
HEMATOCRIT: 36.1 % (ref 36.0–46.0)
HEMOGLOBIN: 12.3 g/dL (ref 12.0–15.0)
MCH: 29 pg (ref 26.0–34.0)
MCHC: 34.1 g/dL (ref 30.0–36.0)
MCV: 85.1 fL (ref 78.0–100.0)
PLATELETS: 206 10*3/uL (ref 150–400)
RBC: 4.24 MIL/uL (ref 3.87–5.11)
RDW: 14.2 % (ref 11.5–15.5)
WBC: 13.1 10*3/uL — AB (ref 4.0–10.5)

## 2014-10-18 LAB — TYPE AND SCREEN
ABO/RH(D): O POS
ANTIBODY SCREEN: NEGATIVE

## 2014-10-18 LAB — ABO/RH: ABO/RH(D): O POS

## 2014-10-18 MED ORDER — EPHEDRINE 5 MG/ML INJ
10.0000 mg | INTRAVENOUS | Status: DC | PRN
  Filled 2014-10-18: qty 2

## 2014-10-18 MED ORDER — OXYCODONE-ACETAMINOPHEN 5-325 MG PO TABS: 2.0000 | ORAL_TABLET | ORAL | Status: DC | PRN

## 2014-10-18 MED ORDER — LACTATED RINGERS IV SOLN: 500.0000 mL | INTRAVENOUS | Status: DC | PRN

## 2014-10-18 MED ORDER — PHENYLEPHRINE 40 MCG/ML (10ML) SYRINGE FOR IV PUSH (FOR BLOOD PRESSURE SUPPORT)
80.0000 ug | PREFILLED_SYRINGE | INTRAVENOUS | Status: DC | PRN
  Filled 2014-10-18: qty 20
  Filled 2014-10-18: qty 2

## 2014-10-18 MED ORDER — LIDOCAINE HCL (PF) 1 % IJ SOLN
INTRAMUSCULAR | Status: DC | PRN
  Administered 2014-10-18 (×2): 8 mL

## 2014-10-18 MED ORDER — OXYCODONE-ACETAMINOPHEN 5-325 MG PO TABS: 1.0000 | ORAL_TABLET | ORAL | Status: DC | PRN

## 2014-10-18 MED ORDER — LACTATED RINGERS IV SOLN
INTRAVENOUS | Status: DC
Start: 2014-10-18 — End: 2014-10-18
  Administered 2014-10-18 (×2): via INTRAVENOUS

## 2014-10-18 MED ORDER — LIDOCAINE HCL (PF) 1 % IJ SOLN
30.0000 mL | INTRAMUSCULAR | Status: DC | PRN
  Filled 2014-10-18: qty 30

## 2014-10-18 MED ORDER — CITRIC ACID-SODIUM CITRATE 334-500 MG/5ML PO SOLN
30.0000 mL | ORAL | Status: DC | PRN
  Filled 2014-10-18: qty 15

## 2014-10-18 MED ORDER — FENTANYL 2.5 MCG/ML BUPIVACAINE 1/10 % EPIDURAL INFUSION (WH - ANES)
INTRAMUSCULAR | Status: DC | PRN
  Administered 2014-10-18: 14 mL/h via EPIDURAL

## 2014-10-18 MED ORDER — OXYTOCIN 40 UNITS IN LACTATED RINGERS INFUSION - SIMPLE MED
1.0000 m[IU]/min | INTRAVENOUS | Status: DC
  Administered 2014-10-18: 2 m[IU]/min via INTRAVENOUS

## 2014-10-18 MED ORDER — ONDANSETRON HCL 4 MG/2ML IJ SOLN: 4.0000 mg | Freq: Four times a day (QID) | INTRAMUSCULAR | Status: DC | PRN

## 2014-10-18 MED ORDER — PHENYLEPHRINE 40 MCG/ML (10ML) SYRINGE FOR IV PUSH (FOR BLOOD PRESSURE SUPPORT)
80.0000 ug | PREFILLED_SYRINGE | INTRAVENOUS | Status: DC | PRN
  Filled 2014-10-18: qty 2

## 2014-10-18 MED ORDER — PENICILLIN G POTASSIUM 5000000 UNITS IJ SOLR
2.5000 10*6.[IU] | INTRAMUSCULAR | Status: DC
Start: 2014-10-18 — End: 2014-10-18
  Administered 2014-10-18 (×2): 2.5 10*6.[IU] via INTRAVENOUS
  Filled 2014-10-18 (×9): qty 2.5

## 2014-10-18 MED ORDER — OXYTOCIN BOLUS FROM INFUSION: 500.0000 mL | INTRAVENOUS | Status: DC

## 2014-10-18 MED ORDER — DIPHENHYDRAMINE HCL 50 MG/ML IJ SOLN: 12.5000 mg | INTRAMUSCULAR | Status: DC | PRN

## 2014-10-18 MED ORDER — OXYTOCIN 40 UNITS IN LACTATED RINGERS INFUSION - SIMPLE MED
62.5000 mL/h | INTRAVENOUS | Status: DC
  Filled 2014-10-18: qty 1000

## 2014-10-18 MED ORDER — LACTATED RINGERS IV SOLN: 500.0000 mL | Freq: Once | INTRAVENOUS | Status: DC

## 2014-10-18 MED ORDER — PENICILLIN G POTASSIUM 5000000 UNITS IJ SOLR
5.0000 10*6.[IU] | Freq: Once | INTRAVENOUS | Status: AC
  Administered 2014-10-18: 5 10*6.[IU] via INTRAVENOUS
  Filled 2014-10-18: qty 5

## 2014-10-18 MED ORDER — TERBUTALINE SULFATE 1 MG/ML IJ SOLN
0.2500 mg | Freq: Once | INTRAMUSCULAR | Status: DC | PRN
  Filled 2014-10-18: qty 1

## 2014-10-18 MED ORDER — ACETAMINOPHEN 325 MG PO TABS: 650.0000 mg | ORAL_TABLET | ORAL | Status: DC | PRN

## 2014-10-18 MED ORDER — FENTANYL 2.5 MCG/ML BUPIVACAINE 1/10 % EPIDURAL INFUSION (WH - ANES)
14.0000 mL/h | INTRAMUSCULAR | Status: DC | PRN
Start: 2014-10-18 — End: 2014-10-18
  Administered 2014-10-18 (×2): 14 mL/h via EPIDURAL
  Filled 2014-10-18 (×2): qty 125

## 2014-10-18 NOTE — Progress Notes (Signed)
Mackenzie MantleShanevea Kennedy is a 29 y.o. G1P0000 at 5256w4d admitted for active labor  Subjective: Pt reports feeling comfortable with epidural  Objective: BP 98/55 mmHg  Pulse 71  Temp(Src) 98.8 F (37.1 C) (Oral)  Resp 16  Ht 5\' 8"  (1.727 m)  Wt 216 lb (97.977 kg)  BMI 32.85 kg/m2  SpO2 100%  LMP 01/07/2014     FHT:  FHR: 140 bpm, variability: moderate,  accelerations:  Present,  decelerations:  Present rare variables UC:   regular, every 4-5 minutes SVE:   Dilation: 4 Effacement (%): 90 Station: -2 Exam by:: Avery DennisonBenji Stanley RN  Labs: Lab Results  Component Value Date   WBC 13.1* 10/18/2014   HGB 12.3 10/18/2014   HCT 36.1 10/18/2014   MCV 85.1 10/18/2014   PLT 206 10/18/2014    Assessment / Plan: Spontaneous labor, progressing normally  Labor: Progressing normally Preeclampsia:  no signs or symptoms of toxicity Fetal Wellbeing:  Category I Pain Control:  Epidural I/D:  GBS pos on PCN Anticipated MOD:  NSVD  Beverely Lowdamo, Elena 10/18/2014, 6:22 AM

## 2014-10-18 NOTE — H&P (Signed)
Mackenzie Kennedy is a 29 y.o. female G1P0000 with IUP at 8827w4d presenting for painful contractions at term. Pt states she has been having regular, every 6-7 minutes contractions, associated with scant vaginal bleeding.  Membranes are intact, with active fetal movement.   [facility] [place] 8 [facility]   Clinic  [address]  Dating  LMP c/w 8 week US  Genetic Screen Declines all genetic testing  Anatomic US nml but limited views of heart.  Declines further US.  GTT  Third trimester: 103  TDaP vaccine 07/30/14  Flu vaccine 06/03/14  GBS positive  Contraception Considering Mirena and Nexplanon  Baby Food Breast   Circumcision girl  Pediatrician   Support Person Justin   Pap neg; GC/CT negx2  Prenatal History/Complications:  Past Medical History: Past Medical History  Diagnosis Date  . Asthma   . Urinary tract infection     Past Surgical History: Past Surgical History  Procedure Laterality Date  . Arthroscopy knee w/ drilling      lt    Obstetrical History: OB History    Gravida Para Term Preterm AB TAB SAB Ectopic Multiple Living   1 0 0 0 0 0 0 0 0 0       Social History: History   Social History  . Marital Status: Single    Spouse Name: N/A  . Number of Children: N/A  . Years of Education: N/A   Occupational History  . engineer    Social History Main Topics  . Smoking status: Never Smoker   . Smokeless tobacco: Never Used  . Alcohol Use: Yes     Comment: rarely  . Drug Use: No  . Sexual Activity:    Partners: Male   Other Topics Concern  . None   Social History Narrative    Family History: Family History  Problem Relation Age of Onset  . Diabetes Mother     Allergies: No Known Allergies  Prescriptions prior to admission  Medication Sig Dispense Refill Last Dose  . Prenatal Multivit-Min-Fe-FA (PRENATAL VITAMINS PO) Take by mouth.   10/17/2014 at Unknown time     Prenatal Transfer Tool  Maternal Diabetes: No Genetic Screening:  Declined Maternal Ultrasounds/Referrals: Normal Fetal Ultrasounds or other Referrals:  None Maternal Substance Abuse:  No Significant Maternal Medications:  None Significant Maternal Lab Results: Lab values include: Group B Strep positive   Review of Systems   Constitutional: Negative for fever and chills Eyes: Negative for visual disturbances Respiratory: Negative for shortness of breath, dyspnea Cardiovascular: Negative for chest pain or palpitations  Gastrointestinal: Negative for vomiting, diarrhea and constipation.  POSITIVE for abdominal pain (contractions) Genitourinary: Negative for dysuria and urgency Musculoskeletal: Negative for back pain, joint pain, myalgias  Neurological: Negative for dizziness and headaches    Blood pressure 129/87, pulse 76, temperature 98.7 F (37.1 C), temperature source Oral, resp. rate 16, height 5\' 8"  (1.727 m), weight 216 lb (97.977 kg), last menstrual period 01/07/2014, SpO2 98 %. General appearance: alert, cooperative and no distress Lungs: clear to auscultation bilaterally Heart: regular rate and rhythm Abdomen: soft, non-tender; bowel sounds normal Pelvic: normal, 4/90/-2 Extremities: no sign of DVT Presentation: cephalic Fetal monitoring  Baseline: 140 bpm, Variability: Good {> 6 bpm), Accelerations: Reactive and Decelerations: Absent Uterine activity  Frequency: Every 2-4 minutes Dilation: 4 Effacement (%): 90 Station: -2 Exam by:: Remigio EisenmengerBenji Stanley RN   Prenatal labs: ABO, Rh: O/POS/-- (07/09 1525) Antibody: NEG (07/09 1525) Rubella:  non-immune RPR: NON REAC (12/03 1657)  HBsAg:  NEGATIVE (07/09 1525)  HIV: NONREACTIVE (12/03 1657)  GBS:   Positive 1 hr Glucola 103 Genetic screening  declined Anatomy US normal but limited views of heart and declined repeat   No results found for this or any previous visit (from the past 24 hour(s)).  Assessment: Mackenzie Kennedy is a 28 y.o. G1P0000 with an IUP at [redacted]w[redacted]d presenting for  painful contractions at term  Plan: #Labor: spontaneous, progressing normally  #Pain:  Planning epidural #FWB category 1 #ID: GBS: positive, start penicillin  #MOF:  breast #MOC: considering mirena vs nexplanon #Circ: n/a   Beverely Low 10/18/2014, 2:56 AM   I have seen this patient and agree with the above resident's note.  LEFTWICH-KIRBY, Macon Lesesne Certified Nurse-Midwife

## 2014-10-18 NOTE — Anesthesia Procedure Notes (Signed)
Epidural Patient location during procedure: OB Start time: 10/18/2014 4:21 AM End time: 10/18/2014 4:25 AM  Staffing Anesthesiologist: Leilani AbleHATCHETT, Felisia Balcom Performed by: anesthesiologist   Preanesthetic Checklist Completed: patient identified, surgical consent, pre-op evaluation, timeout performed, IV checked, risks and benefits discussed and monitors and equipment checked  Epidural Patient position: sitting Prep: site prepped and draped and DuraPrep Patient monitoring: continuous pulse ox and blood pressure Approach: midline Location: L3-L4 Injection technique: LOR air  Needle:  Needle type: Tuohy  Needle gauge: 17 G Needle length: 9 cm and 9 Needle insertion depth: 7 cm Catheter type: closed end flexible Catheter size: 19 Gauge Catheter at skin depth: 11 cm Test dose: negative and Other  Assessment Sensory level: T9 Events: blood not aspirated, injection not painful, no injection resistance, negative IV test and no paresthesia  Additional Notes Reason for block:procedure for pain

## 2014-10-18 NOTE — Anesthesia Preprocedure Evaluation (Signed)

## 2014-10-18 NOTE — MAU Note (Signed)
Contractions every 6-7 min over the last hour , hour/half, they have become more painful.  No bleeding, no leaking.  Baby moving well.

## 2014-10-18 NOTE — Consult Note (Signed)
Neonatology Note:   Attendance at Delivery:   I was asked by Dr. Eure to attend this vacuum-assisted vaginal delivery at term due to persistent FH decelerations. The mother is a G1P0 O pos, GBS pos (adequately treated during labor) with an uncomplicated pregnancy. ROM 1 hours prior to delivery, fluid clear. Infant floppy and blue, breathing but not crying at birth. After bulb suctioning a large amount of thick mucous from the throat and nares, she started to cry. Color improved quickly. Tone recovered more slowly, but was improved at 5 minutes,without focal deficits. Ap 5/8. Lungs clear to ausc in DR, no resp distress. To CN to care of Pediatrician.  Mackenzie Harpole C. Tadhg Eskew, MD 

## 2014-10-18 NOTE — Progress Notes (Signed)
Mackenzie Kennedy is a 29 y.o. G1P0000 at 2770w4d admitted for active labor  Subjective: Pt reports feeling comfortable with epidural  Objective: BP 108/53 mmHg  Pulse 84  Temp(Src) 98.8 F (37.1 C) (Oral)  Resp 16  Ht 5\' 8"  (1.727 m)  Wt 216 lb (97.977 kg)  BMI 32.85 kg/m2  SpO2 100%  LMP 01/07/2014     FHT:  FHR: 140 bpm, variability: moderate,  accelerations:  Present,  decelerations:  Present rare variables UC:   regular, every 4-5 minutes SVE:   Dilation: 6 Effacement (%): 80 Station: -2 Exam by:: Loreta AveAcosta asynclitic   Labs: Lab Results  Component Value Date   WBC 13.1* 10/18/2014   HGB 12.3 10/18/2014   HCT 36.1 10/18/2014   MCV 85.1 10/18/2014   PLT 206 10/18/2014    Assessment / Plan: Spontaneous labor  Labor: essentially no change since last exam, start pitocin 0620 5/80/-2 0930 6/80/-2 asynclitic => start pitocin Preeclampsia:  no signs or symptoms of toxicity Fetal Wellbeing:  Category I Pain Control:  Epidural I/D:  GBS pos on PCN Anticipated MOD:  NSVD  Mackenzie Kennedy Mackenzie Kennedy 10/18/2014, 9:33 AM

## 2014-10-18 NOTE — MAU Note (Signed)
[facility] for pt to come to [facility].

## 2014-10-19 ENCOUNTER — Encounter (HOSPITAL_COMMUNITY): Payer: Self-pay | Admitting: *Deleted

## 2014-10-19 LAB — RPR: RPR Ser Ql: NONREACTIVE

## 2014-10-19 MED ORDER — OXYTOCIN 40 UNITS IN LACTATED RINGERS INFUSION - SIMPLE MED: 62.5000 mL/h | INTRAVENOUS | Status: DC | PRN

## 2014-10-19 MED ORDER — BISACODYL 10 MG RE SUPP: 10.0000 mg | Freq: Every day | RECTAL | Status: DC | PRN

## 2014-10-19 MED ORDER — DIBUCAINE 1 % RE OINT: 1.0000 "application " | TOPICAL_OINTMENT | RECTAL | Status: DC | PRN

## 2014-10-19 MED ORDER — IBUPROFEN 600 MG PO TABS
600.0000 mg | ORAL_TABLET | Freq: Four times a day (QID) | ORAL | Status: DC
  Administered 2014-10-19 – 2014-10-20 (×7): 600 mg via ORAL
  Filled 2014-10-19 (×7): qty 1

## 2014-10-19 MED ORDER — SENNOSIDES-DOCUSATE SODIUM 8.6-50 MG PO TABS
2.0000 | ORAL_TABLET | ORAL | Status: DC
  Administered 2014-10-19 (×2): 2 via ORAL
  Filled 2014-10-19: qty 2

## 2014-10-19 MED ORDER — ONDANSETRON HCL 4 MG/2ML IJ SOLN: 4.0000 mg | INTRAMUSCULAR | Status: DC | PRN

## 2014-10-19 MED ORDER — ZOLPIDEM TARTRATE 5 MG PO TABS: 5.0000 mg | ORAL_TABLET | Freq: Every evening | ORAL | Status: DC | PRN

## 2014-10-19 MED ORDER — ONDANSETRON HCL 4 MG PO TABS: 4.0000 mg | ORAL_TABLET | ORAL | Status: DC | PRN

## 2014-10-19 MED ORDER — SODIUM CHLORIDE 0.9 % IJ SOLN: 3.0000 mL | Freq: Two times a day (BID) | INTRAMUSCULAR | Status: DC

## 2014-10-19 MED ORDER — SODIUM CHLORIDE 0.9 % IV SOLN: 250.0000 mL | INTRAVENOUS | Status: DC | PRN

## 2014-10-19 MED ORDER — LANOLIN HYDROUS EX OINT: TOPICAL_OINTMENT | CUTANEOUS | Status: DC | PRN

## 2014-10-19 MED ORDER — BENZOCAINE-MENTHOL 20-0.5 % EX AERO
1.0000 "application " | INHALATION_SPRAY | CUTANEOUS | Status: DC | PRN
  Administered 2014-10-19: 1 via TOPICAL
  Filled 2014-10-19: qty 56

## 2014-10-19 MED ORDER — WITCH HAZEL-GLYCERIN EX PADS: 1.0000 "application " | MEDICATED_PAD | CUTANEOUS | Status: DC | PRN

## 2014-10-19 MED ORDER — PRENATAL MULTIVITAMIN CH
1.0000 | ORAL_TABLET | Freq: Every day | ORAL | Status: DC
  Administered 2014-10-19 – 2014-10-20 (×2): 1 via ORAL
  Filled 2014-10-19 (×2): qty 1

## 2014-10-19 MED ORDER — DIPHENHYDRAMINE HCL 25 MG PO CAPS: 25.0000 mg | ORAL_CAPSULE | Freq: Four times a day (QID) | ORAL | Status: DC | PRN

## 2014-10-19 MED ORDER — FLEET ENEMA 7-19 GM/118ML RE ENEM: 1.0000 | ENEMA | Freq: Every day | RECTAL | Status: DC | PRN

## 2014-10-19 MED ORDER — OXYCODONE-ACETAMINOPHEN 5-325 MG PO TABS: 1.0000 | ORAL_TABLET | ORAL | Status: DC | PRN

## 2014-10-19 MED ORDER — SIMETHICONE 80 MG PO CHEW: 80.0000 mg | CHEWABLE_TABLET | ORAL | Status: DC | PRN

## 2014-10-19 MED ORDER — SODIUM CHLORIDE 0.9 % IJ SOLN: 3.0000 mL | INTRAMUSCULAR | Status: DC | PRN

## 2014-10-19 NOTE — Anesthesia Postprocedure Evaluation (Signed)
  Anesthesia Post-op Note  Anesthesia Post Note  Patient: Mackenzie Kennedy  Procedure(s) Performed: * No procedures listed *  Anesthesia type: Epidural  Patient location: Mother/Baby  Post pain: Pain level controlled  Post assessment: Post-op Vital signs reviewed  Last Vitals:  Filed Vitals:   10/19/14 0546  BP: 111/54  Pulse: 66  Temp: 36.9 C  Resp: 18    Post vital signs: Reviewed  Level of consciousness:alert  Complications: No apparent anesthesia complications

## 2014-10-19 NOTE — Lactation Note (Addendum)
This note was copied from the chart of Mackenzie Kennedy [place]. Lactation Consultation Note  Mom reports that BF has been going well.  Baby had eaten one hour prior to consult and was not actively cuing.  She latched briefly but did not suckle deeply consistently.  Mom was concerned about not having "enough" for the baby so we discussed stomach capacity, supply and demand.  Hand expression was taught.  I asked mom to call for a latch check at next feeding.  Follow-up planned. Patient Name: Mackenzie Kennedy Pretty ZOXWR'UToday's Date: 10/19/2014 Reason for consult: Initial assessment   Maternal Data Has patient been taught Hand Expression?: Yes Does the patient have breastfeeding experience prior to this delivery?: No  Feeding Feeding Type: Breast Fed Length of feed: 14 min  LATCH Score/Interventions Latch: Repeated attempts needed to sustain latch, nipple held in mouth throughout feeding, stimulation needed to elicit sucking reflex.  Audible Swallowing: A few with stimulation  Type of Nipple: Flat (Short shank)  Comfort (Breast/Nipple): Soft / non-tender     Hold (Positioning): Assistance needed to correctly position infant at breast and maintain latch.  LATCH Score: 6  Lactation Tools Discussed/Used Pump Review: Setup, frequency, and cleaning   Consult Status Consult Status: Follow-up Date: 10/20/14 Follow-up type: In-patient    Soyla DryerJoseph, Laelynn Blizzard 10/19/2014, 1:58 PM

## 2014-10-19 NOTE — Progress Notes (Signed)
Post Partum Day 1 Subjective: no complaints, up ad lib, voiding, tolerating PO and + flatus Perineum is sore Breastfeeding well  Objective: Blood pressure 111/54, pulse 66, temperature 98.4 F (36.9 C), temperature source Oral, resp. rate 18, height 5\' 8"  (1.727 m), weight 216 lb (97.977 kg), last menstrual period 01/07/2014, SpO2 100 %, unknown if currently breastfeeding.  Physical Exam:  General: alert, cooperative and no distress Lochia: appropriate Uterine Fundus: firm Incision: healing well DVT Evaluation: No evidence of DVT seen on physical exam.   Recent Labs  10/18/14 0255  HGB 12.3  HCT 36.1    Assessment/Plan: Plan for discharge tomorrow, Breastfeeding and Lactation consult   LOS: 1 day   [facility] Center[facility]ILLIAMS,Donia Yokum 10/19/2014, 8:02 AM

## 2014-10-20 ENCOUNTER — Other Ambulatory Visit: Payer: 59

## 2014-10-20 MED ORDER — MEASLES, MUMPS & RUBELLA VAC ~~LOC~~ INJ
0.5000 mL | INJECTION | Freq: Once | SUBCUTANEOUS | Status: AC
Start: 2014-10-20 — End: 2014-10-20
  Administered 2014-10-20: 0.5 mL via SUBCUTANEOUS
  Filled 2014-10-20 (×2): qty 0.5

## 2014-10-20 NOTE — Discharge Instructions (Signed)

## 2014-10-20 NOTE — Discharge Summary (Signed)
Obstetric Discharge Summary Reason for Admission: onset of labor Prenatal Procedures: ultrasound Intrapartum Procedures: spontaneous vaginal delivery Postpartum Procedures: none Complications-Operative and Postpartum: 2nd degree, essentially only episiotomy   Delivery Note At 4:18 PM a viable female was delivered via Vaginal, Vacuum (Extractor) (Presentation: Left Occiput Anterior).  APGAR: 5, 8; weight 6 lb 3.8 oz (2830 g).   Placenta status: Intact, Spontaneous.  Cord: 3 vessels with the following complications: None.  Cord pH: 7.05   Hospital Course:  Active Problems:   Active labor at term   Mackenzie Kennedy is a 29 y.o. G1P1001 s/p SVD.  She has postpartum course that was uncomplicated including no problems with ambulating, PO intake, urination, pain, or bleeding. The pt feels ready to go home and  will be discharged with outpatient follow-up.   Today: No acute events overnight.  Pt denies problems with ambulating, voiding or po intake.  She denies nausea or vomiting.  Pain is well controlled.  She has had flatus. She has had bowel movement.  Lochia Minimal.  Plan for birth control is  mirena vs nexplanon.  Method of Feeding: breast  H/H: Lab Results  Component Value Date/Time   HGB 12.3 10/18/2014 02:55 AM   HCT 36.1 10/18/2014 02:55 AM    Discharge Diagnoses: Term Pregnancy-delivered  Discharge Information: Date: 10/20/2014 Activity: pelvic rest Diet: routine  Medications: None Breast feeding:  Yes Condition: stable Instructions: refer to handout Discharge to: home      Medication List    TAKE these medications        PRENATAL VITAMINS PO  Take 1 tablet by mouth daily.           Follow-up Information    Follow up with DOVE,MYRA C., MD. Schedule an appointment as soon as possible for a visit in 6 weeks.   Specialty:  Obstetrics and Gynecology   Why:  postpartum follow up   Contact information:   [address] [place]  [id number] [phone]       Myra RudeSchmitz, Jeremy E ,MD PGY-2  10/20/2014,7:28 AM  OB fellow attestation I have seen and examined this patient and agree with above documentation in the resident's note.   Mackenzie Kennedy is a 29 y.o. G1P1001 s/p VAVD.   Pain is well controlled.  Plan for birth control is Mirena IUD vs Nexplanon.  Method of Feeding: breast  PE:  BP 102/63 mmHg  Pulse 80  Temp(Src) 98.3 F (36.8 C) (Oral)  Resp 18  Ht 5\' 8"  (1.727 m)  Wt 216 lb (97.977 kg)  BMI 32.85 kg/m2  SpO2 100%  LMP 01/07/2014  Breastfeeding? Unknown Fundus firm   Recent Labs  10/18/14 0255  HGB 12.3  HCT 36.1     Plan: discharge today - postpartum care discussed - f/u clinic in 6 weeks for postpartum visit   William DaltonMcEachern, Snigdha Howser, MD 7:37 AM

## 2014-10-23 ENCOUNTER — Inpatient Hospital Stay (HOSPITAL_COMMUNITY): Admission: RE | Admit: 2014-10-23 | Payer: 59 | Source: Ambulatory Visit

## 2014-11-27 ENCOUNTER — Encounter: Payer: Self-pay | Admitting: Family

## 2014-11-27 ENCOUNTER — Ambulatory Visit (INDEPENDENT_AMBULATORY_CARE_PROVIDER_SITE_OTHER): Payer: 59 | Admitting: Family

## 2014-11-27 NOTE — Progress Notes (Signed)
Patient ID: Mackenzie Kennedy, female   DOB: 01/17/1986, 29 y.o.   MRN: [id number] Post Partum Exam  Mackenzie Kennedy is a 29 y.o. female  who presents for a postpartum visit. She is 6 weeks postpartum following a vacuum assisted SVD. I have fully reviewed the prenatal and intrapartum course. The delivery was at 40w 4d gestational weeks. Outcome:female child. Anesthesia: epidural Postpartum course has been unremarkable. Baby's course has been unremarkable. Baby is feeding by breast. Bleeding no bleeding. No bleeding x 2 weeks.  Bowel function is normal. Bladder function is normal. Patient is not sexually active. Contraception method is undecided.   Last pap smear July 2015, negative.  Postpartum depression screening: negative  The following portions of the patient's history were reviewed and updated as appropriate: allergies, current medications, past family history, past medical history, past social history, past surgical history and problem list.  Review of Systems Pertinent items are noted in HPI.   Objective:   Filed Vitals:   11/27/14 0944  BP: 122/78  Pulse: 82  Resp: 16    General:  alert, cooperative and appears stated age   Breasts:  inspection negative, no nipple discharge or bleeding, no masses or nodularity palpable  Lungs: clear to auscultation bilaterally  Heart:  regular rate and rhythm, S1, S2 normal, no murmur, click, rub or gallop  Abdomen: soft, non-tender; bowel sounds normal; no masses,  no organomegaly   Vulva:  normal  Vagina: normal vagina, no discharge, exudate, lesion, or erythema; healed well  Cervix:  no cervical motion tenderness  Corpus: normal size, contour, position, consistency, mobility, non-tender  Adnexa:  normal adnexa  Rectal Exam: Not performed.   Assessment:    Normal postpartum exam. Pap smear not done at today's visit.   Plan:    1. Contraception: plans to discuss with husband.  Deciding between Mirena and OCPs.  Reviewed the risks and benefits  of both.    2. Follow up in: one week once a decision has been made regarding family planning.    Eino FarberWalidah Kennith GainN Karim, CNM

## 2014-11-30 ENCOUNTER — Ambulatory Visit: Payer: 59 | Admitting: Obstetrics & Gynecology

## 2014-12-04 ENCOUNTER — Ambulatory Visit (INDEPENDENT_AMBULATORY_CARE_PROVIDER_SITE_OTHER): Payer: 59 | Admitting: Advanced Practice Midwife

## 2014-12-04 ENCOUNTER — Encounter: Payer: Self-pay | Admitting: Advanced Practice Midwife

## 2014-12-04 VITALS — BP 127/73 | HR 81 | Resp 16

## 2014-12-04 DIAGNOSIS — Z01812 Encounter for preprocedural laboratory examination: Secondary | ICD-10-CM | POA: Diagnosis not present

## 2014-12-04 DIAGNOSIS — Z30430 Encounter for insertion of intrauterine contraceptive device: Secondary | ICD-10-CM | POA: Diagnosis not present

## 2014-12-04 LAB — POCT URINE PREGNANCY: Preg Test, Ur: NEGATIVE

## 2014-12-04 MED ORDER — LEVONORGESTREL 20 MCG/24HR IU IUD
1.0000 | INTRAUTERINE_SYSTEM | Freq: Once | INTRAUTERINE | Status: AC
  Administered 2014-12-04: 1 via INTRAUTERINE

## 2014-12-04 NOTE — Patient Instructions (Signed)

## 2014-12-07 NOTE — Progress Notes (Signed)
Subjective:     Patient ID: Mackenzie Kennedy, Mackenzie Mantlefemale   DOB: 10/08/1985, 29 y.o.   MRN: [id number]  HPI: Here for Mirena IUD insertion. Has not had IC since before delivery.    Review of Systems; Neg for abd pain, vaginal bleeding, fever, chills.      Objective:   Physical Exam  BP 127/73 mmHg  Pulse 81  Resp 16  Breastfeeding? Yes PELVIC: NEFG. Laceration well-healed. Scant thin white vaginal discharge. Cervic closed, clean.   PROCEDURE NOTE: Patient identified, informed consent performed, signed copy in chart, time out was performed.  Urine pregnancy test negative.  Speculum placed in the vagina.  Cervix visualized.  Cleaned with Betadine x 2.  Grasped anteriourly with a single tooth tenaculum.  Uterus sounded to 7 cm.  Mirena IUD placed per manufacturer's recommendations.  Strings trimmed to 3 cm. Pt tolerated well.   Patient given post procedure instructions and Mirena care card with expiration date.  Patient is asked to check IUD strings periodically and follow up in 4-6 weeks for IUD check.    Assessment:     Mirena insertion     Plan:     Follow up in 4-6 weeks for IUD check. Mirena after-care handout given.    [place] Delainee Tramel, CNM 12/07/2014 9:01 AM

## 2015-01-04 ENCOUNTER — Ambulatory Visit (INDEPENDENT_AMBULATORY_CARE_PROVIDER_SITE_OTHER): Payer: 59 | Admitting: Advanced Practice Midwife

## 2015-01-04 ENCOUNTER — Encounter: Payer: Self-pay | Admitting: Advanced Practice Midwife

## 2015-01-04 VITALS — BP 132/87 | HR 68 | Resp 16 | Ht 68.0 in | Wt 188.0 lb

## 2015-01-04 DIAGNOSIS — O924 Hypogalactia: Secondary | ICD-10-CM

## 2015-01-04 DIAGNOSIS — Z30431 Encounter for routine checking of intrauterine contraceptive device: Secondary | ICD-10-CM

## 2015-01-04 DIAGNOSIS — Z975 Presence of (intrauterine) contraceptive device: Secondary | ICD-10-CM

## 2015-01-04 NOTE — Patient Instructions (Signed)
Dr. Marshell LevanJack Kennedy   Fenugreek (2 caps three times per day)   Breastfeeding Challenges and Solutions Even though breastfeeding is natural, it can be challenging, especially in the first few weeks after childbirth. It is normal for problems to arise when starting to breastfeed your new baby, even if you have breastfed before. This document provides some solutions to the most common breastfeeding challenges.  CHALLENGES AND SOLUTIONS Challenge--Cracked or Sore Nipples Cracked or sore nipples are commonly experienced by breastfeeding mothers. Cracked or sore nipples often are caused by inadequate latching (when your baby's mouth attaches to your breast to breastfeed). Soreness can also happen if your baby is not positioned properly at your breast. Although nipple cracking and soreness are common during the first week after birth, nipple pain is never normal. If you experience nipple cracking or soreness that lasts longer than 1 week or nipple pain, call your health care provider or lactation consultant.  Solution Ensure proper latching and positioning of your baby by following the steps below:  Find a comfortable place to sit or lie down, with your neck and back well supported.  Place a pillow or rolled up blanket under your baby to bring him or her to the level of your breast (if you are seated).  Make sure that your baby's abdomen is facing your abdomen.  Gently massage your breast. With your fingertips, massage from your chest wall toward your nipple in a circular motion. This encourages milk flow. You may need to continue this action during the feeding if your milk flows slowly.  Support your breast with 4 fingers underneath and your thumb above your nipple. Make sure your fingers are well away from your nipple and your baby's mouth.  Stroke your baby's lips gently with your finger or nipple.  When your baby's mouth is open wide enough, quickly bring your baby to your breast, placing your  entire nipple and as much of the colored area around your nipple (areola) as possible into your baby's mouth.  More areola should be visible above your baby's upper lip than below the lower lip.  Your baby's tongue should be between his or her lower gum and your breast.  Ensure that your baby's mouth is correctly positioned around your nipple (latched). Your baby's lips should create a seal on your breast and be turned out (everted).  It is common for your baby to suck for about 2-3 minutes in order to start the flow of breast milk. Signs that your baby has successfully latched on to your nipple include:   Quietly tugging or quietly sucking without causing you pain.   Swallowing heard between every 3-4 sucks.   Muscle movement above and in front of his or her ears with sucking.  Signs that your baby has not successfully latched on to nipple include:   Sucking sounds or smacking sounds from your baby while nursing.   Nipple pain.  Ensure that your breasts stay moisturized and healthy by:  Avoiding the use of soap on your nipples.   Wearing a supportive bra. Avoid wearing underwire-style bras or tight bras.   Air drying your nipples for 3-4 minutes after each feeding.   Using only cotton bra pads to absorb breast milk leakage. Leaking of breast milk between feedings is normal. Be sure to change the pads if they become soaked with milk.  Using lanolin on your nipples after nursing. Lanolin helps to maintain your skin's normal moisture barrier. If you use pure lanolin you  do not need to wash it off before feeding your baby again. Pure lanolin is not toxic to your baby. You may also hand express a few drops of breast milk and gently massage that milk into your nipples, allowing it to air dry. Challenge--Breast Engorgement Breast engorgement is the overfilling of your breasts with breast milk. In the first few weeks after giving birth, you may experience breast engorgement.  Breast engorgement can make your breasts throb and feel hard, tightly stretched, warm, and tender. Engorgement peaks about the fifth day after you give birth. Having breast engorgement does not mean you have to stop breastfeeding your baby. Solution  Breastfeed when you feel the need to reduce the fullness of your breasts or when your baby shows signs of hunger. This is called "breastfeeding on demand."  Newborns (babies younger than 4 weeks) often breastfeed every 1-3 hours during the day. You may need to awaken your baby to feed if he or she is asleep at a feeding time.  Do not allow your baby to sleep longer than 5 hours during the night without a feeding.  Pump or hand express breast milk before breastfeeding to soften your breast, areola, and nipple.  Apply warm, moist heat (in the shower or with warm water-soaked hand towels) just before feeding or pumping, or massage your breast before or during breastfeeding. This increases circulation and helps your milk to flow.  Completely empty your breasts when breastfeeding or pumping. Afterward, wear a snug bra (nursing or regular) or tank top for 1-2 days to signal your body to slightly decrease milk production. Only wear snug bras or tank tops to treat engorgement. Tight bras typically should be avoided by breastfeeding mothers. Once engorgement is relieved, return to wearing regular, loose-fitting clothes.  Apply ice packs to your breasts to lessen the pain from engorgement and relieve swelling, unless the ice is uncomfortable for you.  Do not delay feedings. Try to relax when it is time to feed your baby. This helps to trigger your "let-down reflex," which releases milk from your breast.  Ensure your baby is latched on to your breast and positioned properly while breastfeeding.  Allow your baby to remain at your breast as long as he or she is latched on well and actively sucking. Your baby will let you know when he or she is done breastfeeding  by pulling away from your breast or falling asleep.  Avoid introducing bottles or pacifiers to your baby in the early weeks of breastfeeding. Wait to introduce these things until after resolving any breastfeeding challenges.  Try to pump your milk on the same schedule as when your baby would breastfeed if you are returning to work or away from home for an extended period.  Drink plenty of fluids to avoid dehydration, which can eventually put you at greater risk of breast engorgement. If you follow these suggestions, your engorgement should improve in 24-48 hours. If you are still experiencing difficulty, call your lactation consultant or health care provider.  Challenge--Plugged Milk Ducts Plugged milk ducts occur when the duct does not drain milk effectively and becomes swollen. Wearing a tight-fitting nursing bra or having difficulty with latching may cause plugged milk ducts. Not drinking enough water (8-10 c [1.9-2.4 L] per day) can contribute to plugged milk ducts. Once a duct has become plugged, hard lumps, soreness, and redness may develop in your breast.  Solution Do not delay feedings. Feed your baby frequently and try to empty your breasts of milk  at each feeding. Try breastfeeding from the affected side first so there is a better chance that the milk will drain completely from that breast. Apply warm, moist towels to your breasts for 5-10 minutes before feeding. Alternatively, a hot shower right before breastfeeding can provide the moist heat that can encourage milk flow. Gentle massage of the sore area before and during a feeding may also help. Avoid wearing tight clothing or bras that put pressure on your breasts. Wear bras that offer good support to your breasts, but avoid underwire bras. If you have a plugged milk duct and develop a fever, you need to see your health care provider.  Challenge--Mastitis Mastitis is inflammation of your breast. It usually is caused by a bacterial infection  and can cause flu-like symptoms. You may develop redness in your breast and a fever. Often when mastitis occurs, your breast becomes firm, warm, and very painful. The most common causes of mastitis are poor latching, ineffective sucking from your baby, consistent pressure on your breast (possibly from wearing a tight-fitting bra or shirt that restricts the milk flow), unusual stress or fatigue, or missed feedings.  Solution You will be given antibiotic medicine to treat the infection. It is still important to breastfeed frequently to empty your breasts. Continuing to breastfeed while you recover from mastitis will not harm your baby. Make sure your baby is positioned properly during every feeding. Apply moist heat to your breasts for a few minutes before feeding to help the milk flow and to help your breasts empty more easily. Challenge--Thrush Mackenzie Kennedy is a yeast infection that can form on your nipples, in your breast, or in your baby's mouth. It causes itching, soreness, burning or stabbing pain, and sometimes a rash.  Solution You will be given a medicated ointment for your nipples, and your baby will be given a liquid medicine for his or her mouth. It is important that you and your baby are treated at the same time because thrush can be passed between you and your baby. Change disposable nursing pads often. Any bras, towels, or clothing that come in contact with infected areas of your body or your baby's body need to be washed in very hot water every day. Wash your hands and your baby's hands often. All pacifiers, bottle nipples, or toys your baby puts in his or her mouth should be boiled once a day for 20 minutes. After 1 week of treatment, discard pacifiers and bottle nipples and buy new ones. All breast pump parts that touch the milk need to be boiled for 20 minutes every day. Challenge--Low Milk Supply You may not be producing enough milk if your baby is not gaining the proper amount of weight. Breast  milk production is based on a supply-and-demand system. Your milk supply depends on how frequently and effectively your baby empties your breast. Solution The more you breastfeed and pump, the more breast milk you will produce. It is important that your baby empties at least one of your breasts at each feeding. If this is not happening, then use a breast pump or hand express any milk that remains. This will help to drain as much milk as possible at each feeding. It will also signal your body to produce more milk. If your baby is not emptying your breasts, it may be due to latching, sucking, or positioning problems. If low milk supply continues after addressing these issues, contact your health care provider or a lactation specialist as soon as possible. Challenge--Inverted  or Flat Nipples Some women have nipples that turn inward instead of protruding outward. Other women have nipples that are flat. Inverted or flat nipples can sometimes make it more difficult for your baby to latch onto your breast. Solution You may be given a small device that pulls out inverted nipples. This device should be applied right before your baby is brought to your breast. You can also try using a breast pump for a short time before placing the baby at your breast. The pump can pull your nipple outwards to help your infant latch more easily. The baby's sucking motion will help the inverted nipple protrude as well.  If you have flat nipples, encourage your baby to latch onto your breast and feed frequently in the early days after birth. This will give your baby practice latching on correctly while your breast is still soft. When your milk supply increases, between the second and fifth day after birth and your breasts become full, your baby will have an easier time latching.  Contact a lactation consultant if you still have concerns. She or he can teach you additional techniques to address breastfeeding problems related to nipple  shape and position.  FOR MORE INFORMATION [employer]: www.llli.org Document Released: 02/05/2006 Document Revised: 08/19/2013 Document Reviewed: 02/07/2013 [facility] Patient Information 2015 [place], [place]. This information is not intended to replace advice given to you by your health care provider. Make sure you discuss any questions you have with your health care provider.

## 2015-01-04 NOTE — Progress Notes (Signed)
   Subjective:    Patient ID: Mackenzie Kennedy, female    DOB: 03/07/1986, 29 y.o.   MRN: [id number]  HPI: String check. Mirena placed 12/04/14. No problems. Decreased mild supply, but baby gaining weight well.   Review of Systems: Neg for fever, chills, abd pain, vaginal bleeding or dyspareunia.      Objective:   Physical Exam  BP 132/87 mmHg  Pulse 68  Resp 16  Ht 5\' 8"  (1.727 m)  Wt 188 lb (85.276 kg)  BMI 28.59 kg/m2  Breastfeeding? Yes General: NAD, A&O x 4 Abd: Soft, NT. Pelvic: NEFG, scant, thin, tan, odorless discharge. Strings seen.  Bimanual: Andreae NT, no masses. No CMT. IUD not palpable at external os.     Assessment & Plan:  1. Decreased milk production Reassured pt that milk supply is often more efficiently regulated at this point result is less feeling of fulness and that baby having good weight gain is most important measure of supply. Encouraged to to stay well-hydrated, nurse or pump every three to four hours (linger occassionally at night is fine) and use Fenugreek supplements PRN if supply is truly low. OP LC available.   2. IUD check up Encourage to check strings monthly  F/U 1 year for annual exam. Pap due in two years.  [place] Dannell Raczkowski, CNM 01/04/2015 2:54 PM

## 2015-02-04 ENCOUNTER — Encounter (INDEPENDENT_AMBULATORY_CARE_PROVIDER_SITE_OTHER): Payer: Self-pay

## 2015-02-04 ENCOUNTER — Encounter: Payer: Self-pay | Admitting: Obstetrics & Gynecology

## 2015-02-04 ENCOUNTER — Ambulatory Visit (INDEPENDENT_AMBULATORY_CARE_PROVIDER_SITE_OTHER): Payer: 59 | Admitting: Obstetrics & Gynecology

## 2015-02-04 VITALS — BP 118/79 | HR 69 | Resp 16 | Ht 68.0 in | Wt 188.0 lb

## 2015-02-04 DIAGNOSIS — R1013 Epigastric pain: Secondary | ICD-10-CM

## 2015-02-04 DIAGNOSIS — Z30431 Encounter for routine checking of intrauterine contraceptive device: Secondary | ICD-10-CM | POA: Diagnosis not present

## 2015-02-04 DIAGNOSIS — T8369XD Infection and inflammatory reaction due to other prosthetic device, implant and graft in genital tract, subsequent encounter: Secondary | ICD-10-CM

## 2015-02-04 NOTE — Progress Notes (Signed)
   Subjective:    Patient ID: Mackenzie Kennedy, female    DOB: 1985/11/14, 29 y.o.   MRN: [id number]  HPI  Patient presents for f/u of IUD (string check).  Pat happy with IUD.  No issues.  No problems with pelvic pain or irregular bleeding.  Pt thinks she feels her strings.  Patient has new c/o of upper abdominal pain.  It occurs in the mornings lasting 20 minutes.  It only happened once this week.  It is not worse with eating or drinking.  Patient has one soft browm BM every 3 days.  She does not think she is constipated.   She denies problems with urination.  No nausea.    Review of Systems  Constitutional: Negative.   Respiratory: Negative.   Cardiovascular: Negative.   Gastrointestinal: Positive for abdominal pain. Negative for nausea, vomiting, diarrhea, constipation, blood in stool and rectal pain.  Genitourinary: Negative.   Musculoskeletal: Negative.   Psychiatric/Behavioral: Negative.        Objective:   Physical Exam  Constitutional: She is oriented to person, place, and time. She appears well-developed and well-nourished. No distress.  HENT:  Head: Normocephalic and atraumatic.  Eyes: Conjunctivae are normal.  Pulmonary/Chest: Effort normal.  Abdominal: Soft. Bowel sounds are normal. She exhibits no distension and no mass. There is no tenderness. There is no rebound and no guarding.  Genitourinary: Vagina normal and uterus normal.  Adnexa non tender  Musculoskeletal: She exhibits no edema.  Neurological: She is alert and oriented to person, place, and time.  Skin: Skin is warm and dry.  Psychiatric: She has a normal mood and affect.  Vitals reviewed.         Assessment & Plan:  29 yo female with nml iud string check and new onset upper abdominal pain.    1-Continue IUD 2-Having more frequent BMs could help with pain.  Suggest finding PCP to address pain if it becomes worse.

## 2015-09-18 IMAGING — US US OB COMP +14 WK
2 series · 12 of 28 positions shown · non-contrast
Comparison: none

[Series 1: us ob comp +14 wk · 0.22mm/px · 9 of 90 slices shown (1 of 2)]
[im 5/90]
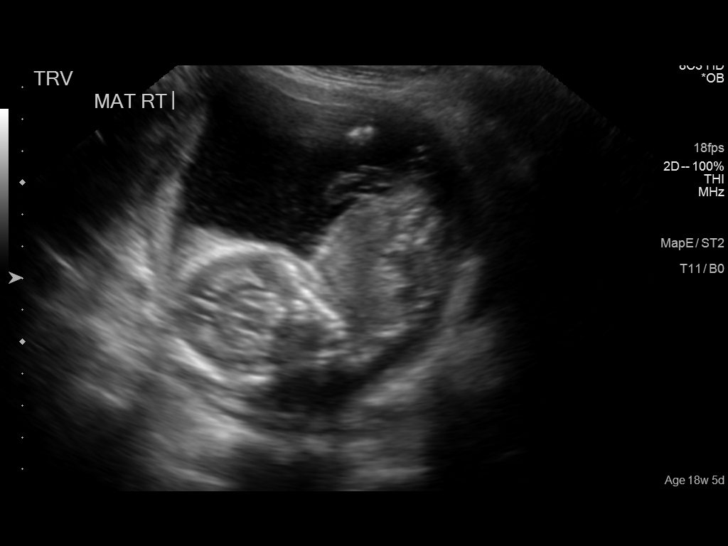
[im 15/90]
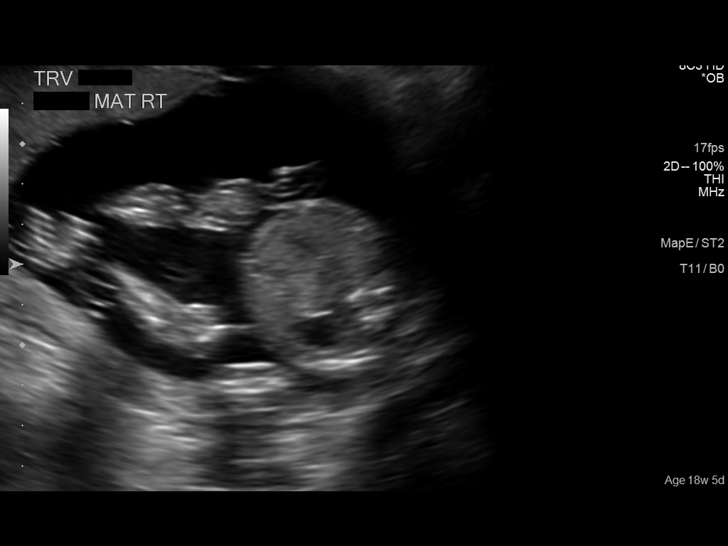
[im 24/90]
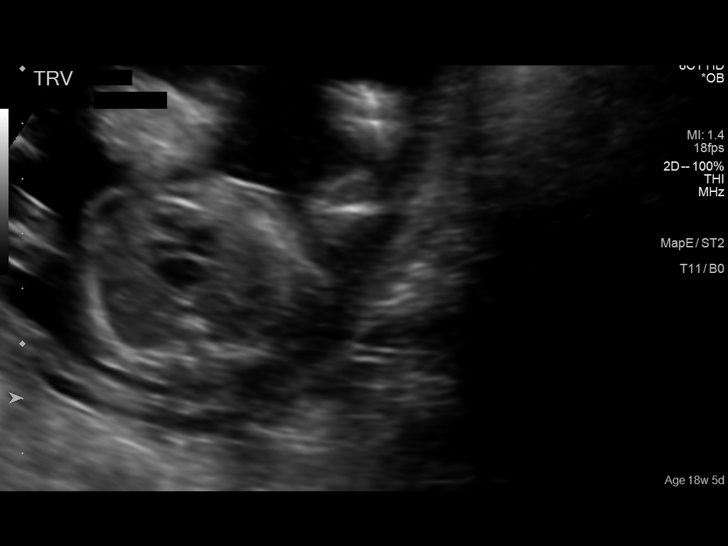
[im 38/90]
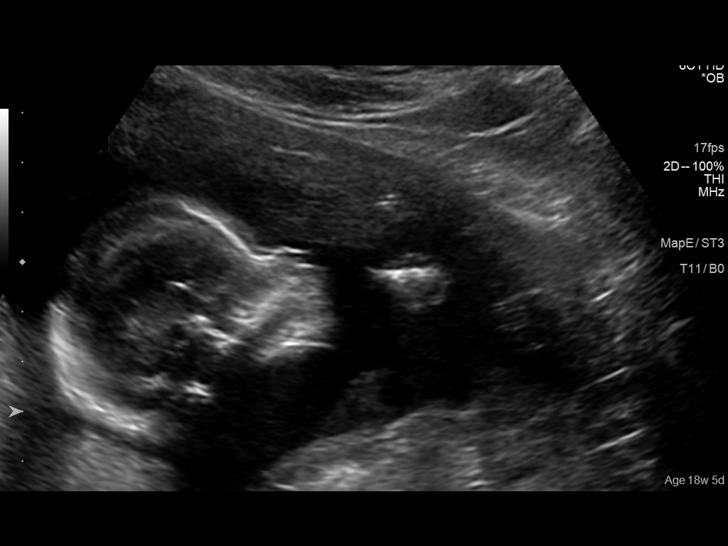
[im 47/90]
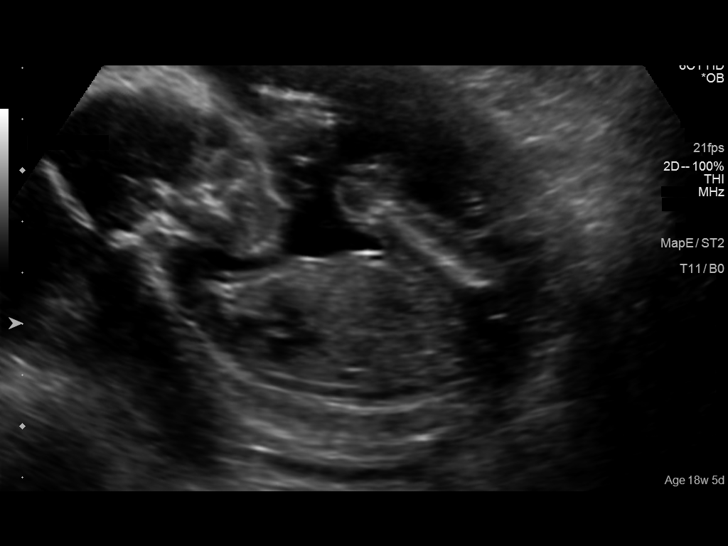
[im 57/90]
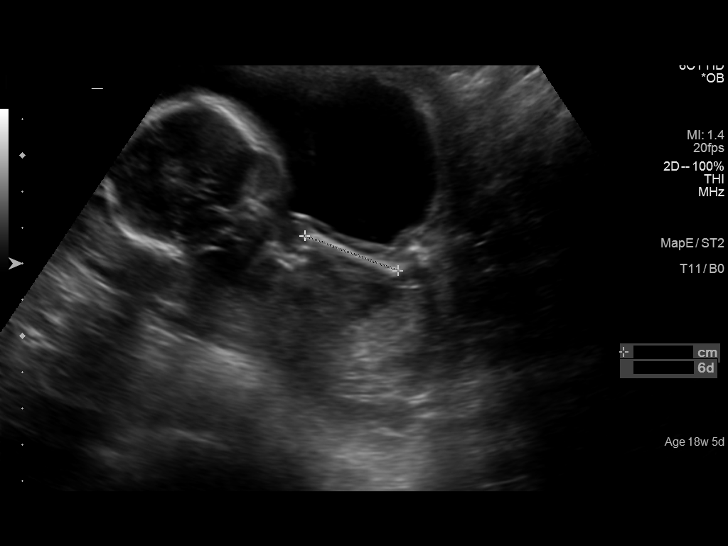
[im 71/90]
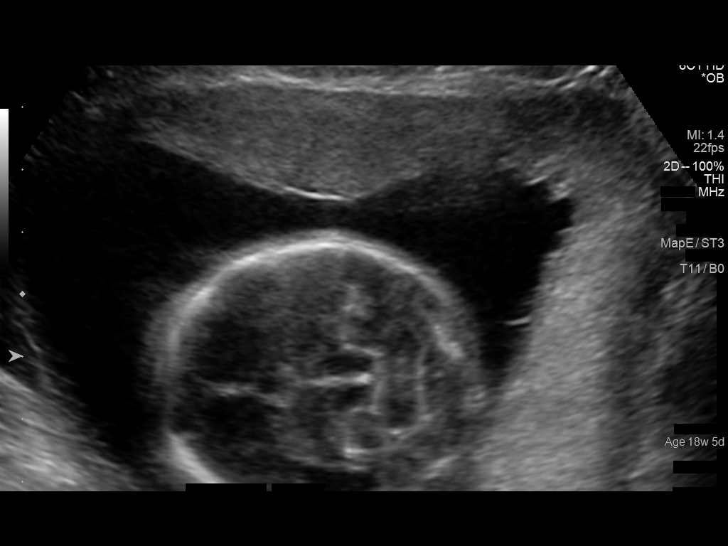
[im 80/90]
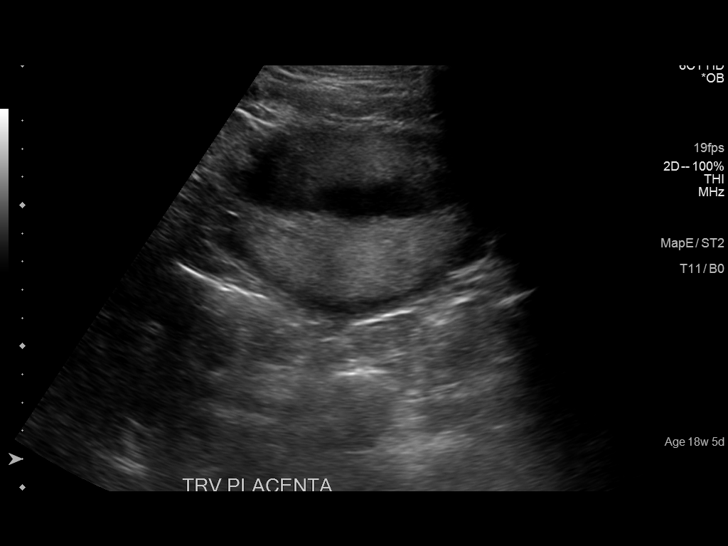
[im 90/90]
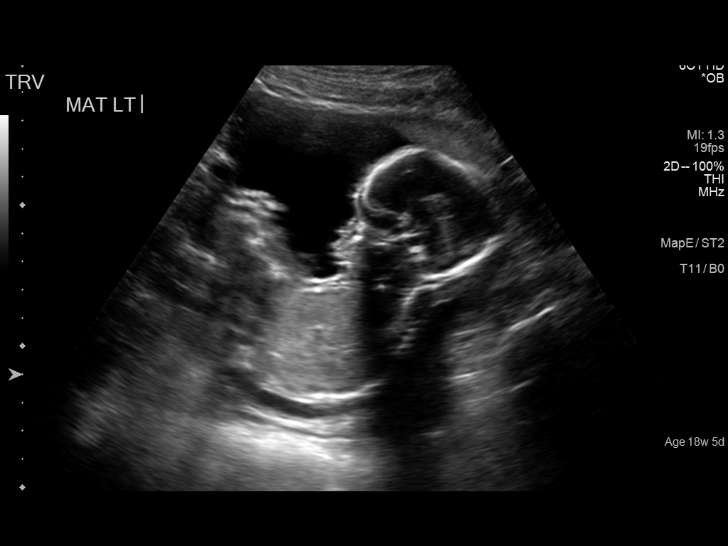

[Series 1001: us ob comp +14 wk · 0.15mm/px · 3 of 34 slices shown (2 of 2)]
[im 10/34]
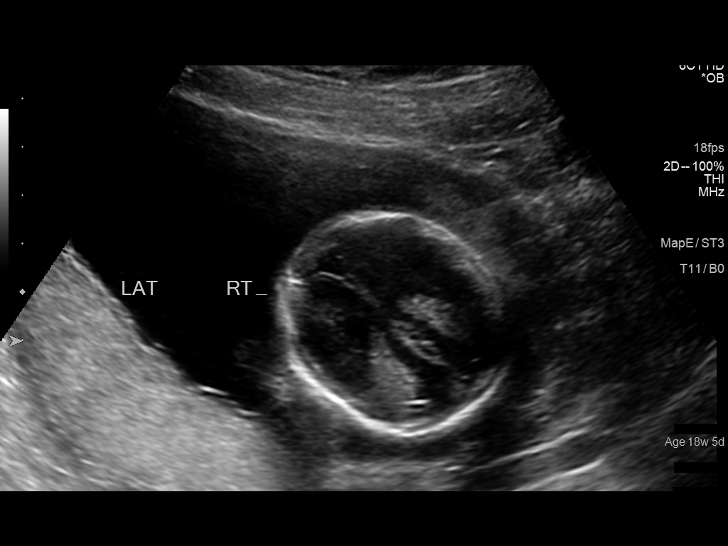
[im 19/34]
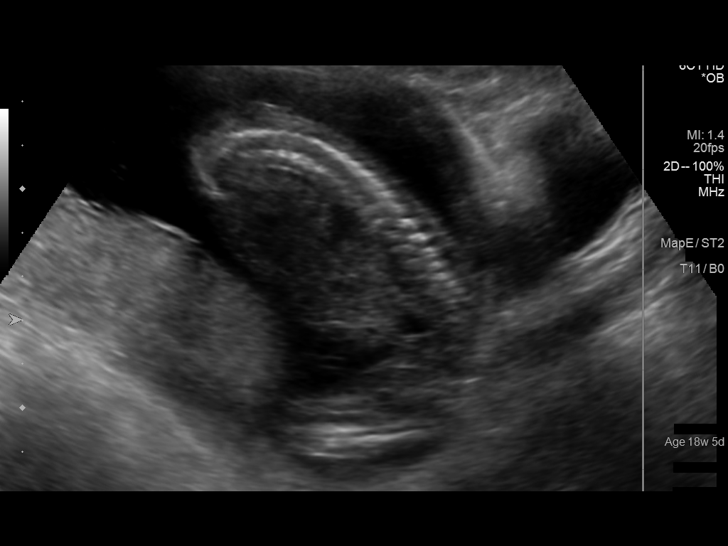
[im 29/34]
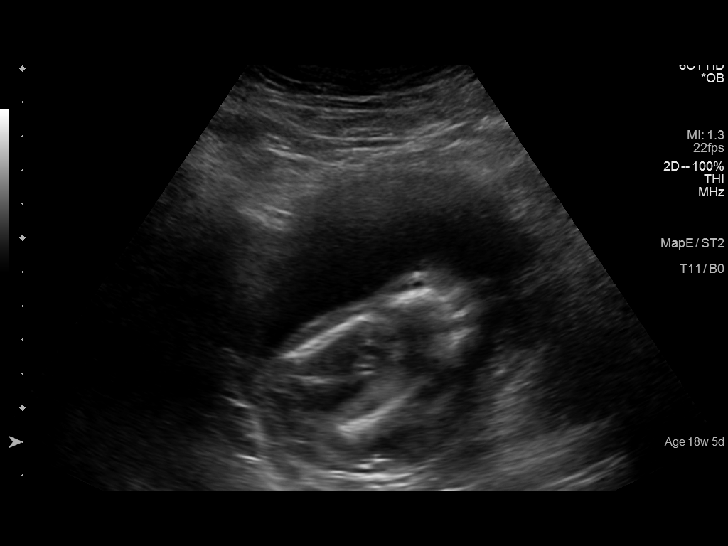

[12 of 28 positions shown; findings below may reference images not displayed]

OBSTETRICS REPORT
                      (Signed Final 05/18/2014 [DATE])

Service(s) Provided

 US OB COMP + 14 WK                                    76805.1
Indications

 Basic anatomic survey
Fetal Evaluation

 Num Of Fetuses:    1
 Cardiac Activity:  Observed
 Presentation:      Variable
 Placenta:          Posterior Fundal, above
                    cervical os
 P. Cord            Marginal insertion
 Insertion:

 Amniotic Fluid
 AFI FV:      Subjectively within normal limits
                                             Larg Pckt:    5.81  cm
Biometry

 BPD:     43.7  mm     G. Age:  19w 1d                CI:        77.53   70 - 86
                                                      FL/HC:      17.5   16.1 -

 HC:     157.1  mm     G. Age:  18w 6d       38  %    HC/AC:      1.21   1.09 -

 AC:     129.9  mm     G. Age:  18w 4d       41  %    FL/BPD:
 FL:      27.5  mm     G. Age:  18w 3d       33  %    FL/AC:      21.2   20 - 24
 HUM:       27  mm     G. Age:  18w 4d       49  %
 CER:     19.6  mm     G. Age:  18w 6d       54  %

 Est. FW:     243  gm      0 lb 9 oz     41  %
Gestational Age

 LMP:           18w 5d        Date:  01/07/14                 EDD:   10/14/14
 U/S Today:     18w 5d                                        EDD:   10/14/14
 Best:          18w 5d     Det. By:  LMP  (01/07/14)          EDD:   10/14/14
Anatomy

 Cranium:          Appears normal         Aortic Arch:      Appears normal
 Fetal Cavum:      Appears normal         Ductal Arch:      Not well visualized
 Ventricles:       Appears normal         Diaphragm:        Appears normal
 Choroid Plexus:   Appears normal         Stomach:          Appears normal, left
                                                            sided
 Cerebellum:       Appears normal         Abdomen:          Appears normal
 Posterior Fossa:  Appears normal         Abdominal Wall:   Appears nml (cord
                                                            insert, abd wall)
 Nuchal Fold:      Appears normal         Cord Vessels:     Appears normal (3
                                                            vessel cord)
 Face:             Appears normal         Kidneys:          Appear normal
                   (orbits and profile)
 Lips:             Not well visualized    Bladder:          Appears normal
 Palate:           Not well visualized    Spine:            Not well visualized
 Heart:            Not well visualized    Lower             Appears normal
                                          Extremities:
 RVOT:             Not well visualized    Upper             Appears normal
                                          Extremities:
 LVOT:             Appears normal

 Other:  Gender not well visualized.
Targeted Anatomy

 Fetal Central Nervous System
 Cisterna Magna:
Cervix Uterus Adnexa

 Cervical Length:    3.73     cm

 Cervix:       Normal appearance by transabdominal scan.
 Uterus:       No abnormality visualized.
 Cul De Sac:   No free fluid seen.
 Left Ovary:    Not visualized.
 Right Ovary:   Within normal limits.

 Adnexa:     No adnexal mass visualized.
Impression

 Single IUP at 18w 5d
 Normal fetal anatomic survey
 Limited views of the fetal heart were obtained due to fetal
 position
 Posterior fundal placenta
 Marginal cord insertion
 Normal amniotic fluid volume
Recommendations

 Recommend follow-up ultrasound examination in 4-6 weeks
 to complete anatomy

 questions or concerns.

## 2016-03-13 ENCOUNTER — Encounter: Payer: Self-pay | Admitting: Obstetrics & Gynecology

## 2016-03-13 ENCOUNTER — Ambulatory Visit (INDEPENDENT_AMBULATORY_CARE_PROVIDER_SITE_OTHER): Payer: 59 | Admitting: Obstetrics & Gynecology

## 2016-03-13 VITALS — BP 129/79 | HR 61 | Resp 16 | Ht 68.0 in | Wt 191.0 lb

## 2016-03-13 DIAGNOSIS — Z30432 Encounter for removal of intrauterine contraceptive device: Secondary | ICD-10-CM | POA: Diagnosis not present

## 2016-03-13 DIAGNOSIS — Z01419 Encounter for gynecological examination (general) (routine) without abnormal findings: Secondary | ICD-10-CM

## 2016-03-13 DIAGNOSIS — Z30011 Encounter for initial prescription of contraceptive pills: Secondary | ICD-10-CM | POA: Diagnosis not present

## 2016-03-13 DIAGNOSIS — Z124 Encounter for screening for malignant neoplasm of cervix: Secondary | ICD-10-CM

## 2016-03-13 MED ORDER — NORGESTIMATE-ETH ESTRADIOL 0.25-35 MG-MCG PO TABS: 1.0000 | ORAL_TABLET | Freq: Every day | ORAL | Status: DC

## 2016-03-13 NOTE — Addendum Note (Signed)
Addended by: Granville LewisLARK, Chrystel Barefield L on: 03/13/2016 03:28 PM   Modules accepted: Orders, SmartSet

## 2016-03-13 NOTE — Progress Notes (Signed)
  Subjective:     Mackenzie Kennedy is a 30 y.o. female here for a routine exam.  Current complaints: cramping which she has to take aspirin for.  Has happened several times over the past few months.  Not associated with cycle, but doesn't have a regular menses with IUD.  No constipation.  No dysuria.  Wants IUD removed and start OCPs.   Gynecologic History No LMP recorded. Patient is not currently having periods (Reason: IUD). Contraception: NuvaRing vaginal inserts Last Pap: 2015. Results were: normal Last mammogram: n/a.  Obstetric History OB History  Gravida Para Term Preterm AB SAB TAB Ectopic Multiple Living  1 1 1  0 0 0 0 0 0 1    # Outcome Date GA Lbr Len/2nd Weight Sex Delivery Anes PTL Lv  1 Term 10/18/14 [id number] 11:20 / 03:58 6 lb 3.8 oz (2.83 kg) F Vag-Vacuum EPI  Y       The following portions of the patient's history were reviewed and updated as appropriate: allergies, current medications, past family history, past medical history, past social history, past surgical history and problem list.  Review of Systems Pertinent items noted in HPI and remainder of comprehensive ROS otherwise negative.    Objective:      Filed Vitals:   03/13/16 1441  BP: 129/79  Pulse: 61  Resp: 16  Height: 5\' 8"  (1.727 m)  Weight: 191 lb (86.637 kg)   Vitals:  WNL General appearance: alert, cooperative and no distress  HEENT: Normocephalic, without obvious abnormality, atraumatic Eyes: negative Throat: lips, mucosa, and tongue normal; teeth and gums normal  Respiratory: Clear to auscultation bilaterally  CV: Regular rate and rhythm  Breasts:  Normal appearance, no masses or tenderness, no nipple retraction or dimpling  GI: Soft, non-tender; bowel sounds normal; no masses,  no organomegaly  GU: External Genitalia:  Tanner V, no lesion Urethra:  No prolapse   Vagina: Pink, normal rugae, no blood or discharge  Cervix: No CMT, no lesion, iud strings present  Uterus:  Normal size and  contour, non tender  Adnexa: Normal, no masses, non tender  Musculoskeletal: No edema, redness or tenderness in the calves or thighs  Skin: No lesions or rash  Lymphatic: Axillary adenopathy: none    Psychiatric: Normal mood and behavior    Assessment:    Healthy female exam.   Pelvic cramping that patient associates with IUD.   Plan:    Education reviewed: skin cancer screening. Contraception: OCP (estrogen/progesterone). Follow up in: 1 year. OCP prescribed--condoms for first 2 weeks.  Reviewed how to take if pills are skipped.

## 2016-03-15 LAB — CYTOLOGY - PAP

## 2016-04-14 ENCOUNTER — Other Ambulatory Visit: Payer: Self-pay | Admitting: *Deleted

## 2016-04-14 DIAGNOSIS — Z3041 Encounter for surveillance of contraceptive pills: Secondary | ICD-10-CM

## 2016-04-14 MED ORDER — NORGESTIMATE-ETH ESTRADIOL 0.25-35 MG-MCG PO TABS: 1.0000 | ORAL_TABLET | Freq: Every day | ORAL | 3 refills | Status: DC

## 2016-04-14 NOTE — Telephone Encounter (Signed)
Received a request from [facility] for her OCP's to be for a 90 day supply vs monthly.  Authorization OK'd for 90 day supply with 3 RF.

## 2016-05-29 ENCOUNTER — Ambulatory Visit (INDEPENDENT_AMBULATORY_CARE_PROVIDER_SITE_OTHER): Payer: 59 | Admitting: Obstetrics & Gynecology

## 2016-05-29 ENCOUNTER — Encounter: Payer: Self-pay | Admitting: Obstetrics & Gynecology

## 2016-05-29 VITALS — BP 140/90 | Ht 68.0 in | Wt 188.0 lb

## 2016-05-29 DIAGNOSIS — L292 Pruritus vulvae: Secondary | ICD-10-CM | POA: Diagnosis not present

## 2016-05-29 DIAGNOSIS — N898 Other specified noninflammatory disorders of vagina: Secondary | ICD-10-CM

## 2016-05-29 DIAGNOSIS — Z0000 Encounter for general adult medical examination without abnormal findings: Secondary | ICD-10-CM

## 2016-05-29 NOTE — Progress Notes (Signed)
   Subjective:    Patient ID: Mackenzie Kennedy, female    DOB: 10/02/1985, 30 y.o.   MRN: [id number]  HPI 30 yo P421 (4319 month old son) here with a several month h/o itching of her lower left labia minora, especially with wiping. She uses OCPs for contraception. She has not changed her detergent, soaps, etc.   Review of Systems     Objective:   Physical Exam [id number] Breathing, conversing, and ambulating normally Left lower labia majora with several pustules c/w HSV      Assessment & Plan:  Possible HSV- check HSV 2 IgG Long discussion of HSV  Flu vaccine today

## 2016-05-30 ENCOUNTER — Telehealth: Payer: Self-pay

## 2016-05-30 LAB — WET PREP BY MOLECULAR PROBE
CANDIDA SPECIES: POSITIVE — AB
Gardnerella vaginalis: NEGATIVE
TRICHOMONAS VAG: NEGATIVE

## 2016-05-30 LAB — HSV 2 ANTIBODY, IGG

## 2016-05-30 MED ORDER — FLUCONAZOLE 150 MG PO TABS: 150.0000 mg | ORAL_TABLET | Freq: Once | ORAL | 0 refills | Status: AC

## 2016-05-30 NOTE — Telephone Encounter (Signed)
Pt is aware of lab results and is aware that medication has been sent into her pharmacy

## 2016-05-30 NOTE — Telephone Encounter (Signed)
Pt is aware that her labs came back and showed a yeast infection so we sent in meds to her pharmacy

## 2016-05-31 ENCOUNTER — Telehealth: Payer: Self-pay

## 2016-05-31 NOTE — Telephone Encounter (Signed)
Spoke with pt and she is aware that her test results came back and she does not have herpes

## 2016-06-06 ENCOUNTER — Telehealth: Payer: Self-pay | Admitting: *Deleted

## 2016-06-06 NOTE — Telephone Encounter (Signed)
Pt was notified of wet prep results of positive yeast and Diflucan was sent to her pharmacy and she has already received it and taken it.

## 2016-06-06 NOTE — Telephone Encounter (Signed)
-----   Message from Allie BossierMyra C Dove, MD sent at 06/02/2016  8:10 AM EDT ----- She will need diflucan. Thanks

## 2016-11-13 ENCOUNTER — Encounter: Payer: Self-pay | Admitting: Advanced Practice Midwife

## 2016-11-13 ENCOUNTER — Ambulatory Visit (INDEPENDENT_AMBULATORY_CARE_PROVIDER_SITE_OTHER): Payer: 59 | Admitting: Advanced Practice Midwife

## 2016-11-13 VITALS — BP 120/76 | HR 78 | Wt 181.0 lb

## 2016-11-13 DIAGNOSIS — Z3481 Encounter for supervision of other normal pregnancy, first trimester: Secondary | ICD-10-CM

## 2016-11-13 DIAGNOSIS — Z3689 Encounter for other specified antenatal screening: Secondary | ICD-10-CM | POA: Diagnosis not present

## 2016-11-13 DIAGNOSIS — J45909 Unspecified asthma, uncomplicated: Secondary | ICD-10-CM | POA: Insufficient documentation

## 2016-11-13 DIAGNOSIS — Z113 Encounter for screening for infections with a predominantly sexual mode of transmission: Secondary | ICD-10-CM | POA: Diagnosis not present

## 2016-11-13 DIAGNOSIS — J4520 Mild intermittent asthma, uncomplicated: Secondary | ICD-10-CM

## 2016-11-13 DIAGNOSIS — Z3490 Encounter for supervision of normal pregnancy, unspecified, unspecified trimester: Secondary | ICD-10-CM | POA: Insufficient documentation

## 2016-11-13 DIAGNOSIS — Z3A01 Less than 8 weeks gestation of pregnancy: Secondary | ICD-10-CM

## 2016-11-13 MED ORDER — ALBUTEROL SULFATE HFA 108 (90 BASE) MCG/ACT IN AERS: 2.0000 | INHALATION_SPRAY | Freq: Four times a day (QID) | RESPIRATORY_TRACT | 2 refills | Status: AC | PRN

## 2016-11-13 NOTE — Patient Instructions (Signed)
First Trimester of Pregnancy The first trimester of pregnancy is from week 1 until the end of week 13 (months 1 through 3). A week after a sperm fertilizes an egg, the egg will implant on the wall of the uterus. This embryo will begin to develop into a baby. Genes from you and your partner will form the baby. The female genes will determine whether the baby will be a boy or a girl. At 6-8 weeks, the eyes and face will be formed, and the heartbeat can be seen on ultrasound. At the end of 12 weeks, all the baby's organs will be formed. Now that you are pregnant, you will want to do everything you can to have a healthy baby. Two of the most important things are to get good prenatal care and to follow your health care provider's instructions. Prenatal care is all the medical care you receive before the baby's birth. This care will help prevent, find, and treat any problems during the pregnancy and childbirth. Body changes during your first trimester Your body goes through many changes during pregnancy. The changes vary from woman to woman.  You may gain or lose a couple of pounds at first.  You may feel sick to your stomach (nauseous) and you may throw up (vomit). If the vomiting is uncontrollable, call your health care provider.  You may tire easily.  You may develop headaches that can be relieved by medicines. All medicines should be approved by your health care provider.  You may urinate more often. Painful urination may mean you have a bladder infection.  You may develop heartburn as a result of your pregnancy.  You may develop constipation because certain hormones are causing the muscles that push stool through your intestines to slow down.  You may develop hemorrhoids or swollen veins (varicose veins).  Your breasts may begin to grow larger and become tender. Your nipples may stick out more, and the tissue that surrounds them (areola) may become darker.  Your gums may bleed and may be  sensitive to brushing and flossing.  Dark spots or blotches (chloasma, mask of pregnancy) may develop on your face. This will likely fade after the baby is born.  Your menstrual periods will stop.  You may have a loss of appetite.  You may develop cravings for certain kinds of food.  You may have changes in your emotions from day to day, such as being excited to be pregnant or being concerned that something may go wrong with the pregnancy and baby.  You may have more vivid and strange dreams.  You may have changes in your hair. These can include thickening of your hair, rapid growth, and changes in texture. Some women also have hair loss during or after pregnancy, or hair that feels dry or thin. Your hair will most likely return to normal after your baby is born.  What to expect at prenatal visits During a routine prenatal visit:  You will be weighed to make sure you and the baby are growing normally.  Your blood pressure will be taken.  Your abdomen will be measured to track your baby's growth.  The fetal heartbeat will be listened to between weeks 10 and 14 of your pregnancy.  Test results from any previous visits will be discussed.  Your health care provider may ask you:  How you are feeling.  If you are feeling the baby move.  If you have had any abnormal symptoms, such as leaking fluid, bleeding, severe headaches,   or abdominal cramping.  If you are using any tobacco products, including cigarettes, chewing tobacco, and electronic cigarettes.  If you have any questions.  Other tests that may be performed during your first trimester include:  Blood tests to find your blood type and to check for the presence of any previous infections. The tests will also be used to check for low iron levels (anemia) and protein on red blood cells (Rh antibodies). Depending on your risk factors, or if you previously had diabetes during pregnancy, you may have tests to check for high blood  sugar that affects pregnant women (gestational diabetes).  Urine tests to check for infections, diabetes, or protein in the urine.  An ultrasound to confirm the proper growth and development of the baby.  Fetal screens for spinal cord problems (spina bifida) and Down syndrome.  HIV (human immunodeficiency virus) testing. Routine prenatal testing includes screening for HIV, unless you choose not to have this test.  You may need other tests to make sure you and the baby are doing well.  Follow these instructions at home: Medicines  Follow your health care provider's instructions regarding medicine use. Specific medicines may be either safe or unsafe to take during pregnancy.  Take a prenatal vitamin that contains at least 600 micrograms (mcg) of folic acid.  If you develop constipation, try taking a stool softener if your health care provider approves. Eating and drinking  Eat a balanced diet that includes fresh fruits and vegetables, whole grains, good sources of protein such as meat, eggs, or tofu, and low-fat dairy. Your health care provider will help you determine the amount of weight gain that is right for you.  Avoid raw meat and uncooked cheese. These carry germs that can cause birth defects in the baby.  Eating four or five small meals rather than three large meals a day may help relieve nausea and vomiting. If you start to feel nauseous, eating a few soda crackers can be helpful. Drinking liquids between meals, instead of during meals, also seems to help ease nausea and vomiting.  Limit foods that are high in fat and processed sugars, such as fried and sweet foods.  To prevent constipation: ? Eat foods that are high in fiber, such as fresh fruits and vegetables, whole grains, and beans. ? Drink enough fluid to keep your urine clear or pale yellow. Activity  Exercise only as directed by your health care provider. Most women can continue their usual exercise routine during  pregnancy. Try to exercise for 30 minutes at least 5 days a week. Exercising will help you: ? Control your weight. ? Stay in shape. ? Be prepared for labor and delivery.  Experiencing pain or cramping in the lower abdomen or lower back is a good sign that you should stop exercising. Check with your health care provider before continuing with normal exercises.  Try to avoid standing for long periods of time. Move your legs often if you must stand in one place for a long time.  Avoid heavy lifting.  Wear low-heeled shoes and practice good posture.  You may continue to have sex unless your health care provider tells you not to. Relieving pain and discomfort  Wear a good support bra to relieve breast tenderness.  Take warm sitz baths to soothe any pain or discomfort caused by hemorrhoids. Use hemorrhoid cream if your health care provider approves.  Rest with your legs elevated if you have leg cramps or low back pain.  If you develop   varicose veins in your legs, wear support hose. Elevate your feet for 15 minutes, 3-4 times a day. Limit salt in your diet. Prenatal care  Schedule your prenatal visits by the twelfth week of pregnancy. They are usually scheduled monthly at first, then more often in the last 2 months before delivery.  Write down your questions. Take them to your prenatal visits.  Keep all your prenatal visits as told by your health care provider. This is important. Safety  Wear your seat belt at all times when driving.  Make a list of emergency phone numbers, including numbers for family, friends, the hospital, and police and fire departments. General instructions  Ask your health care provider for a referral to a local prenatal education class. Begin classes no later than the beginning of month 6 of your pregnancy.  Ask for help if you have counseling or nutritional needs during pregnancy. Your health care provider can offer advice or refer you to specialists for help  with various needs.  Do not use hot tubs, steam rooms, or saunas.  Do not douche or use tampons or scented sanitary pads.  Do not cross your legs for long periods of time.  Avoid cat litter boxes and soil used by cats. These carry germs that can cause birth defects in the baby and possibly loss of the fetus by miscarriage or stillbirth.  Avoid all smoking, herbs, alcohol, and medicines not prescribed by your health care provider. Chemicals in these products affect the formation and growth of the baby.  Do not use any products that contain nicotine or tobacco, such as cigarettes and e-cigarettes. If you need help quitting, ask your health care provider. You may receive counseling support and other resources to help you quit.  Schedule a dentist appointment. At home, brush your teeth with a soft toothbrush and be gentle when you floss. Contact a health care provider if:  You have dizziness.  You have mild pelvic cramps, pelvic pressure, or nagging pain in the abdominal area.  You have persistent nausea, vomiting, or diarrhea.  You have a bad smelling vaginal discharge.  You have pain when you urinate.  You notice increased swelling in your face, hands, legs, or ankles.  You are exposed to fifth disease or chickenpox.  You are exposed to German measles (rubella) and have never had it. Get help right away if:  You have a fever.  You are leaking fluid from your vagina.  You have spotting or bleeding from your vagina.  You have severe abdominal cramping or pain.  You have rapid weight gain or loss.  You vomit blood or material that looks like coffee grounds.  You develop a severe headache.  You have shortness of breath.  You have any kind of trauma, such as from a fall or a car accident. Summary  The first trimester of pregnancy is from week 1 until the end of week 13 (months 1 through 3).  Your body goes through many changes during pregnancy. The changes vary from  woman to woman.  You will have routine prenatal visits. During those visits, your health care provider will examine you, discuss any test results you may have, and talk with you about how you are feeling. This information is not intended to replace advice given to you by your health care provider. Make sure you discuss any questions you have with your health care provider. Document Released: 08/08/2001 Document Revised: 07/26/2016 Document Reviewed: 07/26/2016 [employer] Interactive Patient Education  2017 [employer]  

## 2016-11-13 NOTE — Progress Notes (Signed)
Bedside U/S shows IUP with FHT of 162 BPM and CRL is 17.285mm GA 8w

## 2016-11-13 NOTE — Progress Notes (Signed)
  Subjective:    Mackenzie Kennedy is a G2P1001 402w6d being seen today for her first obstetrical visit.  Her obstetrical history is significant for normal vaginal delivery 2016. Patient does intend to breast feed. Pregnancy history fully reviewed.  Patient reports some wheezing recently   Remote hx asthma  Vitals:   11/13/16 0942  BP: 120/76  Pulse: 78  Weight: 181 lb (82.1 kg)    HISTORY: OB History  Gravida Para Term Preterm AB Living  2 1 1  0 0 1  SAB TAB Ectopic Multiple Live Births  0 0 0 0 1    # Outcome Date GA Lbr Len/2nd Weight Sex Delivery Anes PTL Lv  2 Current           1 Term 10/18/14 [id number] 11:20 / 03:58 6 lb 3.8 oz (2.83 kg) F Vag-Vacuum EPI  LIV     Past Medical History:  Diagnosis Date  . Asthma    last attack 5-6 yrs ago  . Hx of knee surgery   . Urinary tract infection    Past Surgical History:  Procedure Laterality Date  . ARTHROSCOPY KNEE W/ DRILLING     lt   Family History  Problem Relation Age of Onset  . Diabetes Mother   . Stomach cancer Paternal Grandmother      Exam    Uterus:     Pelvic Exam:    Perineum: Normal Perineum   Vulva: Bartholin's, Urethra, Skene's normal   Vagina:  normal discharge   pH:    Cervix: no cervical motion tenderness   Adnexa: no mass, fullness, tenderness   Bony Pelvis: gynecoid  System: Breast:  normal appearance, no masses or tenderness   Skin: normal coloration and turgor, no rashes    Neurologic: oriented, grossly non-focal   Extremities: normal strength, tone, and muscle mass   HEENT neck supple with midline trachea   Mouth/Teeth mucous membranes moist, pharynx normal without lesions   Neck supple and no masses   Cardiovascular: regular rate and rhythm   Respiratory:  appears well, vitals normal, no respiratory distress, acyanotic, normal RR, ear and throat exam is normal, neck free of mass or lymphadenopathy, chest clear, no wheezing, crepitations, rhonchi, normal symmetric air entry   Abdomen:  soft, non-tender; bowel sounds normal; no masses,  no organomegaly   Urinary: urethral meatus normal      Assessment:    Pregnancy: G2P1001 There are no active problems to display for this patient.       Plan:     Initial labs drawn. Prenatal vitamins. Problem list reviewed and updated. Genetic Screening discussed First Screen: declined.  Ultrasound discussed; fetal survey: ordered.  Follow up in 4 weeks. 50% of 30 min visit spent on counseling and coordination of care.  Routines reviewed Welcomed back to practice Declines genetic screening   Rx Albuterol inhaler Recommend daily claritin  Wynelle BourgeoisMarie Williams 11/13/2016

## 2016-11-14 LAB — PRENATAL PROFILE (SOLSTAS)
ANTIBODY SCREEN: NEGATIVE
Basophils Absolute: 72 cells/uL (ref 0–200)
Basophils Relative: 1 %
EOS PCT: 5 %
Eosinophils Absolute: 360 cells/uL (ref 15–500)
HCT: 41.2 % (ref 35.0–45.0)
HEMOGLOBIN: 13.5 g/dL (ref 11.7–15.5)
HIV 1&2 Ab, 4th Generation: NONREACTIVE
Hepatitis B Surface Ag: NEGATIVE
LYMPHS PCT: 35 %
Lymphs Abs: 2520 cells/uL (ref 850–3900)
MCH: 28.5 pg (ref 27.0–33.0)
MCHC: 32.8 g/dL (ref 32.0–36.0)
MCV: 87.1 fL (ref 80.0–100.0)
MONOS PCT: 7 %
MPV: 10.8 fL (ref 7.5–12.5)
Monocytes Absolute: 504 cells/uL (ref 200–950)
NEUTROS PCT: 52 %
Neutro Abs: 3744 cells/uL (ref 1500–7800)
Platelets: 318 10*3/uL (ref 140–400)
RBC: 4.73 MIL/uL (ref 3.80–5.10)
RDW: 15.3 % — ABNORMAL HIGH (ref 11.0–15.0)
Rh Type: POSITIVE
Rubella: 2.4 Index — ABNORMAL HIGH (ref <0.90)
WBC: 7.2 10*3/uL (ref 3.8–10.8)

## 2016-11-14 LAB — SICKLE CELL SCREEN: Sickle Cell Screen: NEGATIVE

## 2016-11-15 LAB — CULTURE, OB URINE
Colony Count: NO GROWTH
ORGANISM ID, BACTERIA: NO GROWTH

## 2016-11-15 LAB — GC/CHLAMYDIA PROBE AMP (~~LOC~~) NOT AT ARMC
Chlamydia: NEGATIVE
Neisseria Gonorrhea: NEGATIVE

## 2016-12-11 ENCOUNTER — Ambulatory Visit (INDEPENDENT_AMBULATORY_CARE_PROVIDER_SITE_OTHER): Payer: 59 | Admitting: Obstetrics & Gynecology

## 2016-12-11 VITALS — BP 118/78 | HR 81 | Wt 186.0 lb

## 2016-12-11 DIAGNOSIS — J4520 Mild intermittent asthma, uncomplicated: Secondary | ICD-10-CM

## 2016-12-11 DIAGNOSIS — Z3689 Encounter for other specified antenatal screening: Secondary | ICD-10-CM

## 2016-12-11 DIAGNOSIS — Z3481 Encounter for supervision of other normal pregnancy, first trimester: Secondary | ICD-10-CM

## 2016-12-13 NOTE — Progress Notes (Signed)
   PRENATAL VISIT NOTE  Subjective:  Mackenzie Kennedy is a 31 y.o. G2P1001 at [redacted]w[redacted]d being seen today for ongoing prenatal care.  She is currently monitored for the following issues for this low-risk pregnancy and has Supervision of normal pregnancy and Asthma on her problem list.  Patient reports no complaints.   . Vag. Bleeding: None.  Movement: Absent. Denies leaking of fluid.   The following portions of the patient's history were reviewed and updated as appropriate: allergies, current medications, past family history, past medical history, past social history, past surgical history and problem list. Problem list updated.  Objective:   Vitals:   12/11/16 1529  BP: 118/78  Pulse: 81  Weight: 186 lb (84.4 kg)    Fetal Status: Fetal Heart Rate (bpm): 138   Movement: Absent     General:  Alert, oriented and cooperative. Patient is in no acute distress.  Skin: Skin is warm and dry. No rash noted.   Cardiovascular: Normal heart rate noted  Respiratory: Normal respiratory effort, no problems with respiration noted  Abdomen: Soft, gravid, appropriate for gestational age. Pain/Pressure: Absent     Pelvic:  Cervical exam deferred        Extremities: Normal range of motion.  Edema: None  Mental Status: Normal mood and affect. Normal behavior. Normal judgment and thought content.   Assessment and Plan:  Pregnancy: G2P1001 at [redacted]w[redacted]d  1. Encounter for supervision of other normal pregnancy in first trimester Feels better as ending 1st trimester.  Refuses all genetic testing.  2. Encounter for fetal anatomic survey - [place] MFM OB DETAIL +14 WK; Future  18-20 weeks  3. Mild intermittent asthma without complication Rare use of inhaler.    Preterm labor symptoms and general obstetric precautions including but not limited to vaginal bleeding, contractions, leaking of fluid and fetal movement were reviewed in detail with the patient. Please refer to After Visit Summary for other counseling  recommendations.  Return in about 4 weeks (around 01/08/2017).   Lesly Dukes, MD

## 2017-01-08 ENCOUNTER — Encounter: Payer: Self-pay | Admitting: Advanced Practice Midwife

## 2017-01-08 ENCOUNTER — Ambulatory Visit (INDEPENDENT_AMBULATORY_CARE_PROVIDER_SITE_OTHER): Payer: 59 | Admitting: Advanced Practice Midwife

## 2017-01-08 DIAGNOSIS — Z3482 Encounter for supervision of other normal pregnancy, second trimester: Secondary | ICD-10-CM

## 2017-01-08 NOTE — Patient Instructions (Signed)
Second Trimester of Pregnancy The second trimester is from week 14 through week 27 (months 4 through 6). The second trimester is often a time when you feel your best. Your body has adjusted to being pregnant, and you begin to feel better physically. Usually, morning sickness has lessened or quit completely, you may have more energy, and you may have an increase in appetite. The second trimester is also a time when the fetus is growing rapidly. At the end of the sixth month, the fetus is about 9 inches long and weighs about 1 pounds. You will likely begin to feel the baby move (quickening) between 16 and 20 weeks of pregnancy. Body changes during your second trimester Your body continues to go through many changes during your second trimester. The changes vary from woman to woman.  Your weight will continue to increase. You will notice your lower abdomen bulging out.  You may begin to get stretch marks on your hips, abdomen, and breasts.  You may develop headaches that can be relieved by medicines. The medicines should be approved by your health care provider.  You may urinate more often because the fetus is pressing on your bladder.  You may develop or continue to have heartburn as a result of your pregnancy.  You may develop constipation because certain hormones are causing the muscles that push waste through your intestines to slow down.  You may develop hemorrhoids or swollen, bulging veins (varicose veins).  You may have back pain. This is caused by:  Weight gain.  Pregnancy hormones that are relaxing the joints in your pelvis.  A shift in weight and the muscles that support your balance.  Your breasts will continue to grow and they will continue to become tender.  Your gums may bleed and may be sensitive to brushing and flossing.  Dark spots or blotches (chloasma, mask of pregnancy) may develop on your face. This will likely fade after the baby is born.  A dark line from your  belly button to the pubic area (linea nigra) may appear. This will likely fade after the baby is born.  You may have changes in your hair. These can include thickening of your hair, rapid growth, and changes in texture. Some women also have hair loss during or after pregnancy, or hair that feels dry or thin. Your hair will most likely return to normal after your baby is born. What to expect at prenatal visits During a routine prenatal visit:  You will be weighed to make sure you and the fetus are growing normally.  Your blood pressure will be taken.  Your abdomen will be measured to track your baby's growth.  The fetal heartbeat will be listened to.  Any test results from the previous visit will be discussed. Your health care provider may ask you:  How you are feeling.  If you are feeling the baby move.  If you have had any abnormal symptoms, such as leaking fluid, bleeding, severe headaches, or abdominal cramping.  If you are using any tobacco products, including cigarettes, chewing tobacco, and electronic cigarettes.  If you have any questions. Other tests that may be performed during your second trimester include:  Blood tests that check for:  Low iron levels (anemia).  High blood sugar that affects pregnant women (gestational diabetes) between 24 and 28 weeks.  Rh antibodies. This is to check for a protein on red blood cells (Rh factor).  Urine tests to check for infections, diabetes, or protein in the   urine.  An ultrasound to confirm the proper growth and development of the baby.  An amniocentesis to check for possible genetic problems.  Fetal screens for spina bifida and Down syndrome.  HIV (human immunodeficiency virus) testing. Routine prenatal testing includes screening for HIV, unless you choose not to have this test. Follow these instructions at home: Medicines   Follow your health care provider's instructions regarding medicine use. Specific medicines may  be either safe or unsafe to take during pregnancy.  Take a prenatal vitamin that contains at least 600 micrograms (mcg) of folic acid.  If you develop constipation, try taking a stool softener if your health care provider approves. Eating and drinking   Eat a balanced diet that includes fresh fruits and vegetables, whole grains, good sources of protein such as meat, eggs, or tofu, and low-fat dairy. Your health care provider will help you determine the amount of weight gain that is right for you.  Avoid raw meat and uncooked cheese. These carry germs that can cause birth defects in the baby.  If you have low calcium intake from food, talk to your health care provider about whether you should take a daily calcium supplement.  Limit foods that are high in fat and processed sugars, such as fried and sweet foods.  To prevent constipation:  Drink enough fluid to keep your urine clear or pale yellow.  Eat foods that are high in fiber, such as fresh fruits and vegetables, whole grains, and beans. Activity   Exercise only as directed by your health care provider. Most women can continue their usual exercise routine during pregnancy. Try to exercise for 30 minutes at least 5 days a week. Stop exercising if you experience uterine contractions.  Avoid heavy lifting, wear low heel shoes, and practice good posture.  A sexual relationship may be continued unless your health care provider directs you otherwise. Relieving pain and discomfort   Wear a good support bra to prevent discomfort from breast tenderness.  Take warm sitz baths to soothe any pain or discomfort caused by hemorrhoids. Use hemorrhoid cream if your health care provider approves.  Rest with your legs elevated if you have leg cramps or low back pain.  If you develop varicose veins, wear support hose. Elevate your feet for 15 minutes, 3-4 times a day. Limit salt in your diet. Prenatal Care   Write down your questions. Take them  to your prenatal visits.  Keep all your prenatal visits as told by your health care provider. This is important. Safety   Wear your seat belt at all times when driving.  Make a list of emergency phone numbers, including numbers for family, friends, the hospital, and police and fire departments. General instructions   Ask your health care provider for a referral to a local prenatal education class. Begin classes no later than the beginning of month 6 of your pregnancy.  Ask for help if you have counseling or nutritional needs during pregnancy. Your health care provider can offer advice or refer you to specialists for help with various needs.  Do not use hot tubs, steam rooms, or saunas.  Do not douche or use tampons or scented sanitary pads.  Do not cross your legs for long periods of time.  Avoid cat litter boxes and soil used by cats. These carry germs that can cause birth defects in the baby and possibly loss of the fetus by miscarriage or stillbirth.  Avoid all smoking, herbs, alcohol, and unprescribed drugs. Chemicals in   these products can affect the formation and growth of the baby.  Do not use any products that contain nicotine or tobacco, such as cigarettes and e-cigarettes. If you need help quitting, ask your health care provider.  Visit your dentist if you have not gone yet during your pregnancy. Use a soft toothbrush to brush your teeth and be gentle when you floss. Contact a health care provider if:  You have dizziness.  You have mild pelvic cramps, pelvic pressure, or nagging pain in the abdominal area.  You have persistent nausea, vomiting, or diarrhea.  You have a bad smelling vaginal discharge.  You have pain when you urinate. Get help right away if:  You have a fever.  You are leaking fluid from your vagina.  You have spotting or bleeding from your vagina.  You have severe abdominal cramping or pain.  You have rapid weight gain or weight loss.  You  have shortness of breath with chest pain.  You notice sudden or extreme swelling of your face, hands, ankles, feet, or legs.  You have not felt your baby move in over an hour.  You have severe headaches that do not go away when you take medicine.  You have vision changes. Summary  The second trimester is from week 14 through week 27 (months 4 through 6). It is also a time when the fetus is growing rapidly.  Your body goes through many changes during pregnancy. The changes vary from woman to woman.  Avoid all smoking, herbs, alcohol, and unprescribed drugs. These chemicals affect the formation and growth your baby.  Do not use any tobacco products, such as cigarettes, chewing tobacco, and e-cigarettes. If you need help quitting, ask your health care provider.  Contact your health care provider if you have any questions. Keep all prenatal visits as told by your health care provider. This is important. This information is not intended to replace advice given to you by your health care provider. Make sure you discuss any questions you have with your health care provider. Document Released: 08/08/2001 Document Revised: 01/20/2016 Document Reviewed: 10/15/2012 Elsevier Interactive Patient Education  2017 [employer]   Breastfeeding Challenges and Solutions Even though breastfeeding is natural, it can be challenging, especially in the first few weeks after childbirth. It is normal for problems to arise when starting to breastfeed your new baby, even if you have breastfed before. This document provides some solutions to the most common breastfeeding challenges. Challenges and solutions Challenge-Cracked or Sore Nipples  Cracked or sore nipples are commonly experienced by breastfeeding mothers. Cracked or sore nipples often are caused by inadequate latching (when your baby's mouth attaches to your breast to breastfeed). Soreness can also happen if your baby is not positioned properly at your  breast. Although nipple cracking and soreness are common during the first week after birth, nipple pain is never normal. If you experience nipple cracking or soreness that lasts longer than 1 week or nipple pain, call your health care provider or lactation consultant. Solution  Ensure proper latching and positioning of your baby by following the steps below:  Find a comfortable place to sit or lie down, with your neck and back well supported.  Place a pillow or rolled up blanket under your baby to bring him or her to the level of your breast (if you are seated).  Make sure that your baby's abdomen is facing your abdomen.  Gently massage your breast. With your fingertips, massage from your chest wall toward   your nipple in a circular motion. This encourages milk flow. You may need to continue this action during the feeding if your milk flows slowly.  Support your breast with 4 fingers underneath and your thumb above your nipple. Make sure your fingers are well away from your nipple and your baby's mouth.  Stroke your baby's lips gently with your finger or nipple.  When your baby's mouth is open wide enough, quickly bring your baby to your breast, placing your entire nipple and as much of the colored area around your nipple (areola) as possible into your baby's mouth.  More areola should be visible above your baby's upper lip than below the lower lip.  Your baby's tongue should be between his or her lower gum and your breast.  Ensure that your baby's mouth is correctly positioned around your nipple (latched). Your baby's lips should create a seal on your breast and be turned out (everted).  It is common for your baby to suck for about 2-3 minutes in order to start the flow of breast milk. Signs that your baby has successfully latched on to your nipple include:  Quietly tugging or quietly sucking without causing you pain.  Swallowing heard between every 3-4 sucks.  Muscle movement above  and in front of his or her ears with sucking. Signs that your baby has not successfully latched on to nipple include:  Sucking sounds or smacking sounds from your baby while nursing.  Nipple pain. Ensure that your breasts stay moisturized and healthy by:  Avoiding the use of soap on your nipples.  Wearing a supportive bra. Avoid wearing underwire-style bras or tight bras.  Air drying your nipples for 3-4 minutes after each feeding.  Using only cotton bra pads to absorb breast milk leakage. Leaking of breast milk between feedings is normal. Be sure to change the pads if they become soaked with milk.  Using lanolin on your nipples after nursing. Lanolin helps to maintain your skin's normal moisture barrier. If you use pure lanolin you do not need to wash it off before feeding your baby again. Pure lanolin is not toxic to your baby. You may also hand express a few drops of breast milk and gently massage that milk into your nipples, allowing it to air dry. Challenge-Breast Engorgement  Breast engorgement is the overfilling of your breasts with breast milk. In the first few weeks after giving birth, you may experience breast engorgement. Breast engorgement can make your breasts throb and feel hard, tightly stretched, warm, and tender. Engorgement peaks about the fifth day after you give birth. Having breast engorgement does not mean you have to stop breastfeeding your baby. Solution  Breastfeed when you feel the need to reduce the fullness of your breasts or when your baby shows signs of hunger. This is called "breastfeeding on demand."  Newborns (babies younger than 4 weeks) often breastfeed every 1-3 hours during the day. You may need to awaken your baby to feed if he or she is asleep at a feeding time.  Do not allow your baby to sleep longer than 5 hours during the night without a feeding.  Pump or hand express breast milk before breastfeeding to soften your breast, areola, and  nipple.  Apply warm, moist heat (in the shower or with warm water-soaked hand towels) just before feeding or pumping, or massage your breast before or during breastfeeding. This increases circulation and helps your milk to flow.  Completely empty your breasts when breastfeeding or pumping. Afterward,   wear a snug bra (nursing or regular) or tank top for 1-2 days to signal your body to slightly decrease milk production. Only wear snug bras or tank tops to treat engorgement. Tight bras typically should be avoided by breastfeeding mothers. Once engorgement is relieved, return to wearing regular, loose-fitting clothes.  Apply ice packs to your breasts to lessen the pain from engorgement and relieve swelling, unless the ice is uncomfortable for you.  Do not delay feedings. Try to relax when it is time to feed your baby. This helps to trigger your "let-down reflex," which releases milk from your breast.  Ensure your baby is latched on to your breast and positioned properly while breastfeeding.  Allow your baby to remain at your breast as long as he or she is latched on well and actively sucking. Your baby will let you know when he or she is done breastfeeding by pulling away from your breast or falling asleep.  Avoid introducing bottles or pacifiers to your baby in the early weeks of breastfeeding. Wait to introduce these things until after resolving any breastfeeding challenges.  Try to pump your milk on the same schedule as when your baby would breastfeed if you are returning to work or away from home for an extended period.  Drink plenty of fluids to avoid dehydration, which can eventually put you at greater risk of breast engorgement. If you follow these suggestions, your engorgement should improve in 24-48 hours. If you are still experiencing difficulty, call your lactation consultant or health care provider. Challenge-Plugged Milk Ducts  Plugged milk ducts occur when the duct does not drain milk  effectively and becomes swollen. Wearing a tight-fitting nursing bra or having difficulty with latching may cause plugged milk ducts. Not drinking enough water (8-10 c [1.9-2.4 L] per day) can contribute to plugged milk ducts. Once a duct has become plugged, hard lumps, soreness, and redness may develop in your breast. Solution  Do not delay feedings. Feed your baby frequently and try to empty your breasts of milk at each feeding. Try breastfeeding from the affected side first so there is a better chance that the milk will drain completely from that breast. Apply warm, moist towels to your breasts for 5-10 minutes before feeding. Alternatively, a hot shower right before breastfeeding can provide the moist heat that can encourage milk flow. Gentle massage of the sore area before and during a feeding may also help. Avoid wearing tight clothing or bras that put pressure on your breasts. Wear bras that offer good support to your breasts, but avoid underwire bras. If you have a plugged milk duct and develop a fever, you need to see your health care provider. Challenge-Mastitis  Mastitis is inflammation of your breast. It usually is caused by a bacterial infection and can cause flu-like symptoms. You may develop redness in your breast and a fever. Often when mastitis occurs, your breast becomes firm, warm, and very painful. The most common causes of mastitis are poor latching, ineffective sucking from your baby, consistent pressure on your breast (possibly from wearing a tight-fitting bra or shirt that restricts the milk flow), unusual stress or fatigue, or missed feedings. Solution  You will be given antibiotic medicine to treat the infection. It is still important to breastfeed frequently to empty your breasts. Continuing to breastfeed while you recover from mastitis will not harm your baby. Make sure your baby is positioned properly during every feeding. Apply moist heat to your breasts for a few minutes before    feeding to help the milk flow and to help your breasts empty more easily. Challenge-Thrush  Thrush is a yeast infection that can form on your nipples, in your breast, or in your baby's mouth. It causes itching, soreness, burning or stabbing pain, and sometimes a rash. Solution  You will be given a medicated ointment for your nipples, and your baby will be given a liquid medicine for his or her mouth. It is important that you and your baby are treated at the same time because thrush can be passed between you and your baby. Change disposable nursing pads often. Any bras, towels, or clothing that come in contact with infected areas of your body or your baby's body need to be washed in very hot water every day. Wash your hands and your baby's hands often. All pacifiers, bottle nipples, or toys your baby puts in his or her mouth should be boiled once a day for 20 minutes. After 1 week of treatment, discard pacifiers and bottle nipples and buy new ones. All breast pump parts that touch the milk need to be boiled for 20 minutes every day. Challenge-Low Milk Supply  You may not be producing enough milk if your baby is not gaining the proper amount of weight. Breast milk production is based on a supply-and-demand system. Your milk supply depends on how frequently and effectively your baby empties your breast. Solution  The more you breastfeed and pump, the more breast milk you will produce. It is important that your baby empties at least one of your breasts at each feeding. If this is not happening, then use a breast pump or hand express any milk that remains. This will help to drain as much milk as possible at each feeding. It will also signal your body to produce more milk. If your baby is not emptying your breasts, it may be due to latching, sucking, or positioning problems. If low milk supply continues after addressing these issues, contact your health care provider or a lactation specialist as soon as  possible. Challenge-Inverted or Flat Nipples  Some women have nipples that turn inward instead of protruding outward. Other women have nipples that are flat. Inverted or flat nipples can sometimes make it more difficult for your baby to latch onto your breast. Solution  You may be given a small device that pulls out inverted nipples. This device should be applied right before your baby is brought to your breast. You can also try using a breast pump for a short time before placing the baby at your breast. The pump can pull your nipple outwards to help your infant latch more easily. The baby's sucking motion will help the inverted nipple protrude as well. If you have flat nipples, encourage your baby to latch onto your breast and feed frequently in the early days after birth. This will give your baby practice latching on correctly while your breast is still soft. When your milk supply increases, between the second and fifth day after birth and your breasts become full, your baby will have an easier time latching. Contact a lactation consultant if you still have concerns. She or he can teach you additional techniques to address breastfeeding problems related to nipple shape and position. Where to find more information: [employer]: www.llli.org This information is not intended to replace advice given to you by your health care provider. Make sure you discuss any questions you have with your health care provider. Document Released: 02/05/2006 Document Revised: 01/26/2016 Document   Reviewed: 02/07/2013 Elsevier Interactive Patient Education  2017 [employer] Inc.  

## 2017-01-08 NOTE — Progress Notes (Signed)
   PRENATAL VISIT NOTE  Subjective:  Mackenzie Kennedy is a 31 y.o. G2P1001 at 2960w6d being seen today for ongoing prenatal care.  She is currently monitored for the following issues for this low-risk pregnancy and has Supervision of normal pregnancy and Asthma on her problem list.  Patient reports no complaints.   . Vag. Bleeding: None.  Movement: Present. Denies leaking of fluid.   The following portions of the patient's history were reviewed and updated as appropriate: allergies, current medications, past family history, past medical history, past social history, past surgical history and problem list. Problem list updated.  [place] in late [place].   Objective:   Vitals:   01/08/17 0828  BP: 113/74  Pulse: 81  Weight: 182 lb (82.6 kg)    Fetal Status: Fetal Heart Rate (bpm): 145 Fundal Height: 16 cm Movement: Present     General:  Alert, oriented and cooperative. Patient is in no acute distress.  Skin: Skin is warm and dry. No rash noted.   Cardiovascular: Normal heart rate noted  Respiratory: Normal respiratory effort, no problems with respiration noted  Abdomen: Soft, gravid, appropriate for gestational age. Pain/Pressure: Absent     Pelvic:  Cervical exam deferred        Extremities: Normal range of motion.  Edema: None  Mental Status: Normal mood and affect. Normal behavior. Normal judgment and thought content.   Assessment and Plan:  Pregnancy: G2P1001 at 7560w6d  1. Encounter for supervision of other normal pregnancy in second trimester - Anatomy scan scheduled in [place].  - [facility] for records. Encour[place].   Preterm labor symptoms and general obstetric precautions including but not limited to vaginal bleeding, contractions, leaking of fluid and fetal movement were reviewed in detail with the patient. Please refer to After Visit Summary for other counseling recommendations.  Return in about 4 weeks (around 02/05/2017) for  ROB.   Katrinka BlazingSmith, [place], CNM

## 2017-01-29 ENCOUNTER — Encounter: Payer: Self-pay | Admitting: *Deleted

## 2017-01-29 DIAGNOSIS — Z3482 Encounter for supervision of other normal pregnancy, second trimester: Secondary | ICD-10-CM

## 2017-02-12 ENCOUNTER — Encounter: Payer: 59 | Admitting: Obstetrics & Gynecology

## 2017-02-21 ENCOUNTER — Ambulatory Visit (INDEPENDENT_AMBULATORY_CARE_PROVIDER_SITE_OTHER): Payer: 59 | Admitting: Obstetrics & Gynecology

## 2017-02-21 DIAGNOSIS — Z3482 Encounter for supervision of other normal pregnancy, second trimester: Secondary | ICD-10-CM

## 2017-02-21 NOTE — Progress Notes (Signed)
   PRENATAL VISIT NOTE  Subjective:  Mackenzie Kennedy is a 31 y.o. G2P1001 at 2594w1d being seen today for ongoing prenatal care.  She is currently monitored for the following issues for this low-risk pregnancy and has Supervision of normal pregnancy and Asthma on her problem list.  Patient reports no complaints.   . Vag. Bleeding: None.  Movement: Present. Denies leaking of fluid.   The following portions of the patient's history were reviewed and updated as appropriate: allergies, current medications, past family history, past medical history, past social history, past surgical history and problem list. Problem list updated.  Objective:   Vitals:   02/21/17 0845  BP: 111/68  Pulse: 76  Weight: 190 lb (86.2 kg)    Fetal Status: Fetal Heart Rate (bpm): 142   Movement: Present     General:  Alert, oriented and cooperative. Patient is in no acute distress.  Skin: Skin is warm and dry. No rash noted.   Cardiovascular: Normal heart rate noted  Respiratory: Normal respiratory effort, no problems with respiration noted  Abdomen: Soft, gravid, appropriate for gestational age. Pain/Pressure: Absent     Pelvic:  Cervical exam deferred        Extremities: Normal range of motion.  Edema: None  Mental Status: Normal mood and affect. Normal behavior. Normal judgment and thought content.   Assessment and Plan:  Pregnancy: G2P1001 at 3794w1d  1. Encounter for supervision of other normal pregnancy [facility] second [facility] [place]band [place] in [place] Sign record release.   Preterm labor symptoms and general obstetric precautions including but not limited to vaginal bleeding, contractions, leaking of fluid and fetal movement were reviewed in detail with the patient. Please refer to After Visit Summary for other counseling recommendations.  Return in about 4 weeks (around 03/21/2017).   Elsie LincolnKelly Stana Bayon, MD

## 2017-09-18 ENCOUNTER — Encounter (HOSPITAL_COMMUNITY): Payer: Self-pay
